# Patient Record
Sex: Male | Born: 1975 | Race: White | Hispanic: No | Marital: Single | Smoking: Current every day smoker
Health system: Southern US, Community
[De-identification: ages and names within clinical notes are randomized; demographics above are authoritative.]

## PROBLEM LIST (undated history)

## (undated) DIAGNOSIS — A4902 Methicillin resistant Staphylococcus aureus infection, unspecified site: Secondary | ICD-10-CM

## (undated) HISTORY — PX: FRACTURE SURGERY: SHX138

---

## 2014-11-01 ENCOUNTER — Ambulatory Visit
Admission: EM | Admit: 2014-11-01 | Discharge: 2014-11-01 | Disposition: A | Discharge status: Home / Self Care | Payer: PRIVATE HEALTH INSURANCE | Attending: Family Medicine | Admitting: Family Medicine

## 2014-11-01 ENCOUNTER — Encounter: Payer: Self-pay | Admitting: Emergency Medicine

## 2014-11-01 DIAGNOSIS — Z021 Encounter for pre-employment examination: Secondary | ICD-10-CM

## 2014-11-01 DIAGNOSIS — Z024 Encounter for examination for driving license: Secondary | ICD-10-CM

## 2014-11-01 DIAGNOSIS — R319 Hematuria, unspecified: Secondary | ICD-10-CM

## 2014-11-01 LAB — DEPT OF TRANSP DIPSTICK, URINE (ARMC ONLY)
Glucose, UA: NEGATIVE mg/dL
Protein, ur: NEGATIVE mg/dL
Specific Gravity, Urine: 1.01 (ref 1.005–1.030)

## 2014-11-01 NOTE — ED Notes (Signed)
DOT Physical

## 2014-11-01 NOTE — ED Notes (Signed)
corrected

## 2014-11-01 NOTE — ED Provider Notes (Signed)
CSN: [id number]     Arrival date & time 11/01/14  1146 History   First MD Initiated Contact with Patient 11/01/14 1244     Chief Complaint  Patient presents with  . DOT Physical    patient is here for DOT (Consider location/radiation/quality/duration/timing/severity/associated sxs/prior Treatment) The history is provided by the patient. No language interpreter was used.    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 History reviewed. No pertinent past medical history. Past Surgical History  Procedure Laterality Date  . Fracture surgery     History reviewed. No pertinent family history. Social History  Substance Use Topics  . Smoking status: Former Games developer  . Smokeless tobacco: None  . Alcohol Use: Yes     Comment: Occasional    Review of Systems  All other systems reviewed and are negative.  Reports that his mother and her family has autoimmune problems and endocrine problems. He is using electronic vapor to stop smoking and he is also set history of left knee surgery right ankle surgery and he denies having regular doctor at this time.  Allergies  Review of patient's allergies indicates no known allergies.  Home Medications   Prior to Admission medications   Not on File   BP 117/80 mmHg  Pulse 100  Temp(Src) 98.2 F (36.8 C) (Oral)  Resp 16  Ht 6' (1.829 m)  Wt 221 lb (100.245 kg)  BMI 29.97 kg/m2  SpO2 96% Physical Exam  Constitutional: He is oriented to person, place, and time. He appears well-developed.  HENT:  Head: Atraumatic.  Eyes: Pupils are equal, round, and reactive to light.  Neck: Normal range of motion. Neck supple. Thyromegaly present.  Cardiovascular: Normal rate, regular rhythm and normal heart sounds.   Pulmonary/Chest: Effort normal.  Abdominal: Soft. Bowel sounds are normal. He exhibits no distension.   Genitourinary: Penis normal.  Musculoskeletal: Normal range of motion.  Neurological: He is alert and oriented to person, place, and time.  Skin: Skin is warm and dry.  Psychiatric: He has a normal mood and affect.  Vitals reviewed.   ED Course  Procedures (including critical care time) Labs Review Labs Reviewed  DEPT OF TRANSP DIPSTICK, URINE([facility] ONLY) - Abnormal; Notable for the following:    Hgb urine dipstick MODERATE (*)    All other components within normal limits    Imaging Review No results found. 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 Hematuria    DOT form filled out urine normal will give him 2 years on form Patient did have blood in his urine and will be informed blood in his urine I do recommend that he have a follow-up with a PCP of his choice to have a repeat urine done   Hassan Rowan, MD 11/02/14 1501

## 2014-11-01 NOTE — Discharge Instructions (Signed)
Preventive Care for Adults A healthy lifestyle and preventive care can promote health and wellness. Preventive health guidelines for men include the following key practices:  A routine yearly physical is a good way to check with your health care provider about your health and preventative screening. It is a chance to share any concerns and updates on your health and to receive a thorough exam.  Visit your dentist for a routine exam and preventative care every 6 months. Brush your teeth twice a day and floss once a day. Good oral hygiene prevents tooth decay and gum disease.  The frequency of eye exams is based on your age, health, family medical history, use of contact lenses, and other factors. Follow your health care provider's recommendations for frequency of eye exams.  Eat a healthy diet. Foods such as vegetables, fruits, whole grains, low-fat dairy products, and lean protein foods contain the nutrients you need without too many calories. Decrease your intake of foods high in solid fats, added sugars, and salt. Eat the right amount of calories for you.Get information about a proper diet from your health care provider, if necessary.  Regular physical exercise is one of the most important things you can do for your health. Most adults should get at least 150 minutes of moderate-intensity exercise (any activity that increases your heart rate and causes you to sweat) each week. In addition, most adults need muscle-strengthening exercises on 2 or more days a week.  Maintain a healthy weight. The body mass index (BMI) is a screening tool to identify possible weight problems. It provides an estimate of body fat based on height and weight. Your health care provider can find your BMI and can help you achieve or maintain a healthy weight.For adults 20 years and older:  A BMI below 18.5 is considered underweight.  A BMI of 18.5 to 24.9 is normal.  A BMI of 25 to 29.9 is considered overweight.  A BMI  of 30 and above is considered obese.  Maintain normal blood lipids and cholesterol levels by exercising and minimizing your intake of saturated fat. Eat a balanced diet with plenty of fruit and vegetables. Blood tests for lipids and cholesterol should begin at age 76 and be repeated every 5 years. If your lipid or cholesterol levels are high, you are over 50, or you are at high risk for heart disease, you may need your cholesterol levels checked more frequently.Ongoing high lipid and cholesterol levels should be treated with medicines if diet and exercise are not working.  If you smoke, find out from your health care provider how to quit. If you do not use tobacco, do not start.  Lung cancer screening is recommended for adults aged 48-80 years who are at high risk for developing lung cancer because of a history of smoking. A yearly low-dose CT scan of the lungs is recommended for people who have at least a 30-pack-year history of smoking and are a current smoker or have quit within the past 15 years. A pack year of smoking is smoking an average of 1 pack of cigarettes a day for 1 year (for example: 1 pack a day for 30 years or 2 packs a day for 15 years). Yearly screening should continue until the smoker has stopped smoking for at least 15 years. Yearly screening should be stopped for people who develop a health problem that would prevent them from having lung cancer treatment.  If you choose to drink alcohol, do not have more than  2 drinks per day. One drink is considered to be 12 ounces (355 mL) of beer, 5 ounces (148 mL) of wine, or 1.5 ounces (44 mL) of liquor.  Avoid use of street drugs. Do not share needles with anyone. Ask for help if you need support or instructions about stopping the use of drugs.  High blood pressure causes heart disease and increases the risk of stroke. Your blood pressure should be checked at least every 1-2 years. Ongoing high blood pressure should be treated with  medicines, if weight loss and exercise are not effective.  If you are 45-79 years old, ask your health care provider if you should take aspirin to prevent heart disease.  Diabetes screening involves taking a blood sample to check your fasting blood sugar level. This should be done once every 3 years, after age 45, if you are within normal weight and without risk factors for diabetes. Testing should be considered at a younger age or be carried out more frequently if you are overweight and have at least 1 risk factor for diabetes.  Colorectal cancer can be detected and often prevented. Most routine colorectal cancer screening begins at the age of 50 and continues through age 75. However, your health care provider may recommend screening at an earlier age if you have risk factors for colon cancer. On a yearly basis, your health care provider may provide home test kits to check for hidden blood in the stool. Use of a small camera at the end of a tube to directly examine the colon (sigmoidoscopy or colonoscopy) can detect the earliest forms of colorectal cancer. Talk to your health care provider about this at age 50, when routine screening begins. Direct exam of the colon should be repeated every 5-10 years through age 75, unless early forms of precancerous polyps or small growths are found.  People who are at an increased risk for hepatitis B should be screened for this virus. You are considered at high risk for hepatitis B if:  You were born in a country where hepatitis B occurs often. Talk with your health care provider about which countries are considered high risk.  Your parents were born in a high-risk country and you have not received a shot to protect against hepatitis B (hepatitis B vaccine).  You have HIV or AIDS.  You use needles to inject street drugs.  You live with, or have sex with, someone who has hepatitis B.  You are a man who has sex with other men (MSM).  You get hemodialysis  treatment.  You take certain medicines for conditions such as cancer, organ transplantation, and autoimmune conditions.  Hepatitis C blood testing is recommended for all people born from 1945 through 1965 and any individual with known risks for hepatitis C.  Practice safe sex. Use condoms and avoid high-risk sexual practices to reduce the spread of sexually transmitted infections (STIs). STIs include gonorrhea, chlamydia, syphilis, trichomonas, herpes, HPV, and human immunodeficiency virus (HIV). Herpes, HIV, and HPV are viral illnesses that have no cure. They can result in disability, cancer, and death.  If you are at risk of being infected with HIV, it is recommended that you take a prescription medicine daily to prevent HIV infection. This is called preexposure prophylaxis (PrEP). You are considered at risk if:  You are a man who has sex with other men (MSM) and have other risk factors.  You are a heterosexual man, are sexually active, and are at increased risk for HIV infection.    infection.  You take drugs by injection.  You are sexually active with a partner who has HIV.  Talk with your health care provider about whether you are at high risk of being infected with HIV. If you choose to begin PrEP, you should first be tested for HIV. You should then be tested every 3 months for as long as you are taking PrEP.  A one-time screening for abdominal aortic aneurysm (AAA) and surgical repair of large AAAs by ultrasound are recommended for men ages 72 to 53 years who are current or former smokers.  Healthy men should no longer receive prostate-specific antigen (PSA) blood tests as part of routine cancer screening. Talk with your health care provider about prostate cancer screening.  Testicular cancer screening is not recommended for adult males who have no symptoms. Screening includes self-exam, a health care provider exam, and other screening tests. Consult with your health care provider about any symptoms  you have or any concerns you have about testicular cancer.  Use sunscreen. Apply sunscreen liberally and repeatedly throughout the day. You should seek shade when your shadow is shorter than you. Protect yourself by wearing long sleeves, pants, a wide-brimmed hat, and sunglasses year round, whenever you are outdoors.  Once a month, do a whole-body skin exam, using a mirror to look at the skin on your back. Tell your health care provider about new moles, moles that have irregular borders, moles that are larger than a pencil eraser, or moles that have changed in shape or color.  Stay current with required vaccines (immunizations).  Influenza vaccine. All adults should be immunized every year.  Tetanus, diphtheria, and acellular pertussis (Td, Tdap) vaccine. An adult who has not previously received Tdap or who does not know his vaccine status should receive 1 dose of Tdap. This initial dose should be followed by tetanus and diphtheria toxoids (Td) booster doses every 10 years. Adults with an unknown or incomplete history of completing a 3-dose immunization series with Td-containing vaccines should begin or complete a primary immunization series including a Tdap dose. Adults should receive a Td booster every 10 years.  Varicella vaccine. An adult without evidence of immunity to varicella should receive 2 doses or a second dose if he has previously received 1 dose.  Human papillomavirus (HPV) vaccine. Males aged 54-21 years who have not received the vaccine previously should receive the 3-dose series. Males aged 22-26 years may be immunized. Immunization is recommended through the age of 50 years for any male who has sex with males and did not get any or all doses earlier. Immunization is recommended for any person with an immunocompromised condition through the age of 59 years if he did not get any or all doses earlier. During the 3-dose series, the second dose should be obtained 4-8 weeks after the first  dose. The third dose should be obtained 24 weeks after the first dose and 16 weeks after the second dose.  Zoster vaccine. One dose is recommended for adults aged 58 years or older unless certain conditions are present.  Measles, mumps, and rubella (MMR) vaccine. Adults born before 75 generally are considered immune to measles and mumps. Adults born in [birth year] or later should have 1 or more doses of MMR vaccine unless there is a contraindication to the vaccine or there is laboratory evidence of immunity to each of the three diseases. A routine second dose of MMR vaccine should be obtained at least 28 days after the first dose for students  attending postsecondary schools, health care workers, or international travelers. People who received inactivated measles vaccine or an unknown type of measles vaccine during 1963-1967 should receive 2 doses of MMR vaccine. People who received inactivated mumps vaccine or an unknown type of mumps vaccine before 1979 and are at high risk for mumps infection should consider immunization with 2 doses of MMR vaccine. Unvaccinated health care workers born before [birth year] who lack laboratory evidence of measles, mumps, or rubella immunity or laboratory confirmation of disease should consider measles and mumps immunization with 2 doses of MMR vaccine or rubella immunization with 1 dose of MMR vaccine.  Pneumococcal 13-valent conjugate (PCV13) vaccine. When indicated, a person who is uncertain of his immunization history and has no record of immunization should receive the PCV13 vaccine. An adult aged 15 years or older who has certain medical conditions and has not been previously immunized should receive 1 dose of PCV13 vaccine. This PCV13 should be followed with a dose of pneumococcal polysaccharide (PPSV23) vaccine. The PPSV23 vaccine dose should be obtained at least 8 weeks after the dose of PCV13 vaccine. An adult aged 56 years or older who has certain medical conditions and  previously received 1 or more doses of PPSV23 vaccine should receive 1 dose of PCV13. The PCV13 vaccine dose should be obtained 1 or more years after the last PPSV23 vaccine dose.  Pneumococcal polysaccharide (PPSV23) vaccine. When PCV13 is also indicated, PCV13 should be obtained first. All adults aged 37 years and older should be immunized. An adult younger than age 32 years who has certain medical conditions should be immunized. Any person who resides in a nursing home or long-term care facility should be immunized. An adult smoker should be immunized. People with an immunocompromised condition and certain other conditions should receive both PCV13 and PPSV23 vaccines. People with human immunodeficiency virus (HIV) infection should be immunized as soon as possible after diagnosis. Immunization during chemotherapy or radiation therapy should be avoided. Routine use of PPSV23 vaccine is not recommended for American Indians, [place] Natives, or people younger than 65 years unless there are medical conditions that require PPSV23 vaccine. When indicated, people who have unknown immunization and have no record of immunization should receive PPSV23 vaccine. One-time revaccination 5 years after the first dose of PPSV23 is recommended for people aged 19-64 years who have chronic kidney failure, nephrotic syndrome, asplenia, or immunocompromised conditions. People who received 1-2 doses of PPSV23 before age 2 years should receive another dose of PPSV23 vaccine at age 18 years or later if at least 5 years have passed since the previous dose. Doses of PPSV23 are not needed for people immunized with PPSV23 at or after age 29 years.  Meningococcal vaccine. Adults with asplenia or persistent complement component deficiencies should receive 2 doses of quadrivalent meningococcal conjugate (MenACWY-D) vaccine. The doses should be obtained at least 2 months apart. Microbiologists working with certain meningococcal bacteria,  [employer] recruits, people at risk during an outbreak, and people who travel to or live in countries with a high rate of meningitis should be immunized. A first-year college student up through age 15 years who is living in a residence hall should receive a dose if he did not receive a dose on or after his 16th birthday. Adults who have certain high-risk conditions should receive one or more doses of vaccine.  Hepatitis A vaccine. Adults who wish to be protected from this disease, have certain high-risk conditions, work with hepatitis A-infected animals, work in hepatitis A research  travel to or work in countries with a high rate of hepatitis A should be immunized. Adults who were previously unvaccinated and who anticipate close contact with an international adoptee during the first 60 days after arrival in the [place] from a country with a high rate of hepatitis A should be immunized.  Hepatitis B vaccine. Adults should be immunized if they wish to be protected from this disease, have certain high-risk conditions, may be exposed to blood or other infectious body fluids, are household contacts or sex partners of hepatitis B positive people, are clients or workers in certain care facilities, or travel to or work in countries with a high rate of hepatitis B.  Haemophilus influenzae type b (Hib) vaccine. A previously unvaccinated person with asplenia or sickle cell disease or having a scheduled splenectomy should receive 1 dose of Hib vaccine. Regardless of previous immunization, a recipient of a hematopoietic stem cell transplant should receive a 3-dose series 6-12 months after his successful transplant. Hib vaccine is not recommended for adults with HIV infection. Preventive Service / Frequency Ages 81 to 7  Blood pressure check.** / Every 1 to 2 years.  Lipid and cholesterol check.** / Every 5 years beginning at age 49.  Hepatitis C blood test.** / For any individual with known risks for  hepatitis C.  Skin self-exam. / Monthly.  Influenza vaccine. / Every year.  Tetanus, diphtheria, and acellular pertussis (Tdap, Td) vaccine.** / Consult your health care provider. 1 dose of Td every 10 years.  Varicella vaccine.** / Consult your health care provider.  HPV vaccine. / 3 doses over 6 months, if 67 or younger.  Measles, mumps, rubella (MMR) vaccine.** / You need at least 1 dose of MMR if you were born in 1957 or later. You may also need a second dose.  Pneumococcal 13-valent conjugate (PCV13) vaccine.** / Consult your health care provider.  Pneumococcal polysaccharide (PPSV23) vaccine.** / 1 to 2 doses if you smoke cigarettes or if you have certain conditions.  Meningococcal vaccine.** / 1 dose if you are age 28 to 74 years and a Market researcher living in a residence hall, or have one of several medical conditions. You may also need additional booster doses.  Hepatitis A vaccine.** / Consult your health care provider.  Hepatitis B vaccine.** / Consult your health care provider.  Haemophilus influenzae type b (Hib) vaccine.** / Consult your health care provider. Ages 35 to 13  Blood pressure check.** / Every 1 to 2 years.  Lipid and cholesterol check.** / Every 5 years beginning at age 12.  Lung cancer screening. / Every year if you are aged 35-80 years and have a 30-pack-year history of smoking and currently smoke or have quit within the past 15 years. Yearly screening is stopped once you have quit smoking for at least 15 years or develop a health problem that would prevent you from having lung cancer treatment.  Fecal occult blood test (FOBT) of stool. / Every year beginning at age 60 and continuing until age 42. You may not have to do this test if you get a colonoscopy every 10 years.  Flexible sigmoidoscopy** or colonoscopy.** / Every 5 years for a flexible sigmoidoscopy or every 10 years for a colonoscopy beginning at age 83 and continuing until age  50.  Hepatitis C blood test.** / For all people born from [birth year] through 1965 and any individual with known risks for hepatitis C.  Skin self-exam. / Monthly.  Influenza vaccine. / Every  year.  Tetanus, diphtheria, and acellular pertussis (Tdap/Td) vaccine.** / Consult your health care provider. 1 dose of Td every 10 years.  Varicella vaccine.** / Consult your health care provider.  Zoster vaccine.** / 1 dose for adults aged 60 years or older.  Measles, mumps, rubella (MMR) vaccine.** / You need at least 1 dose of MMR if you were born in 1957 or later. You may also need a second dose.  Pneumococcal 13-valent conjugate (PCV13) vaccine.** / Consult your health care provider.  Pneumococcal polysaccharide (PPSV23) vaccine.** / 1 to 2 doses if you smoke cigarettes or if you have certain conditions.  Meningococcal vaccine.** / Consult your health care provider.  Hepatitis A vaccine.** / Consult your health care provider.  Hepatitis B vaccine.** / Consult your health care provider.  Haemophilus influenzae type b (Hib) vaccine.** / Consult your health care provider. Ages 65 and over  Blood pressure check.** / Every 1 to 2 years.  Lipid and cholesterol check.**/ Every 5 years beginning at age 20.  Lung cancer screening. / Every year if you are aged 55-80 years and have a 30-pack-year history of smoking and currently smoke or have quit within the past 15 years. Yearly screening is stopped once you have quit smoking for at least 15 years or develop a health problem that would prevent you from having lung cancer treatment.  Fecal occult blood test (FOBT) of stool. / Every year beginning at age 50 and continuing until age 75. You may not have to do this test if you get a colonoscopy every 10 years.  Flexible sigmoidoscopy** or colonoscopy.** / Every 5 years for a flexible sigmoidoscopy or every 10 years for a colonoscopy beginning at age 50 and continuing until age 75.  Hepatitis C blood  test.** / For all people born from 1945 through 1965 and any individual with known risks for hepatitis C.  Abdominal aortic aneurysm (AAA) screening.** / A one-time screening for ages 65 to 75 years who are current or former smokers.  Skin self-exam. / Monthly.  Influenza vaccine. / Every year.  Tetanus, diphtheria, and acellular pertussis (Tdap/Td) vaccine.** / 1 dose of Td every 10 years.  Varicella vaccine.** / Consult your health care provider.  Zoster vaccine.** / 1 dose for adults aged 60 years or older.  Pneumococcal 13-valent conjugate (PCV13) vaccine.** / Consult your health care provider.  Pneumococcal polysaccharide (PPSV23) vaccine.** / 1 dose for all adults aged 65 years and older.  Meningococcal vaccine.** / Consult your health care provider.  Hepatitis A vaccine.** / Consult your health care provider.  Hepatitis B vaccine.** / Consult your health care provider.  Haemophilus influenzae type b (Hib) vaccine.** / Consult your health care provider. **Family history and personal history of risk and conditions may change your health care provider's recommendations. Document Released: 04/28/2001 Document Revised: 03/07/2013 Document Reviewed: 07/28/2010 [employer] Patient Information 2015 [employer]. This information is not intended to replace advice given to you by your health care provider. Make sure you discuss any questions you have with your health care provider.  

## 2015-11-21 ENCOUNTER — Encounter (HOSPITAL_COMMUNITY): Payer: Self-pay | Admitting: Emergency Medicine

## 2015-11-21 ENCOUNTER — Emergency Department (HOSPITAL_COMMUNITY)
Admission: EM | Admit: 2015-11-21 | Discharge: 2015-11-22 | Disposition: A | Discharge status: Home / Self Care | Payer: BLUE CROSS/BLUE SHIELD | Attending: Emergency Medicine | Admitting: Emergency Medicine

## 2015-11-21 DIAGNOSIS — F17200 Nicotine dependence, unspecified, uncomplicated: Secondary | ICD-10-CM | POA: Insufficient documentation

## 2015-11-21 DIAGNOSIS — L02413 Cutaneous abscess of right upper limb: Secondary | ICD-10-CM | POA: Diagnosis not present

## 2015-11-21 DIAGNOSIS — L0291 Cutaneous abscess, unspecified: Secondary | ICD-10-CM

## 2015-11-21 DIAGNOSIS — L539 Erythematous condition, unspecified: Secondary | ICD-10-CM | POA: Diagnosis present

## 2015-11-21 DIAGNOSIS — L03113 Cellulitis of right upper limb: Secondary | ICD-10-CM

## 2015-11-21 NOTE — ED Triage Notes (Signed)
Pt here with insect bite to right forearm. Pt has swelling to arm with some redness. Pt reports fevers at home, afebrile at this time. Pt sts he started taking clindamycin yesterday.

## 2015-11-21 NOTE — ED Notes (Signed)
updated

## 2015-11-22 MED ORDER — HYDROCODONE-ACETAMINOPHEN 5-325 MG PO TABS: 2.0000 | ORAL_TABLET | ORAL | 0 refills | Status: DC | PRN

## 2015-11-22 MED ORDER — CEFTRIAXONE SODIUM 1 G IJ SOLR
1.0000 g | Freq: Once | INTRAMUSCULAR | Status: AC
  Administered 2015-11-22: 1 g via INTRAMUSCULAR
  Filled 2015-11-22: qty 10

## 2015-11-22 MED ORDER — HYDROCODONE-ACETAMINOPHEN 5-325 MG PO TABS
2.0000 | ORAL_TABLET | Freq: Once | ORAL | Status: AC
Start: 2015-11-22 — End: 2015-11-22
  Administered 2015-11-22: 2 via ORAL
  Filled 2015-11-22: qty 2

## 2015-11-22 MED ORDER — STERILE WATER FOR INJECTION IJ SOLN
INTRAMUSCULAR | Status: AC
  Administered 2015-11-22: 10 mL
  Filled 2015-11-22: qty 10

## 2015-11-22 NOTE — Discharge Instructions (Signed)
Soak, and remove gauze from the wound in 3 days.  Return to ER if not completely resolved by the time you complete your antibiotics.

## 2015-11-22 NOTE — ED Provider Notes (Signed)
[facility]EM[facility]ENCY DEPT Provider Note   CSN: [id number] Arrival date & time: 11/21/15  2125     History   Chief Complaint Chief Complaint  Patient presents with  . Insect Bite    HPI Ronald Jefferson is a 40 y.o. male. He presents with a chief complaint of an insect bite or sting. He states for 5 days ago he felt something sting him on his right arm. He states he swatted at it but did not feel anything. He knows the redness in the next day. It progressed until yesterday. He was seen in urgent care and placed on clindamycin. It is spread beyond the borders were was demarcated by himself at the urgent care yesterday and presents here. He states he had fevers and chills today at home.  Patient reports allergy to amoxicillin. He states he's had clinical myosin since yesterday. He has had Keflex in the past without reaction when asked about certain he has this he states "100% certain".  HPI  History reviewed. No pertinent past medical history.  There are no active problems to display for this patient.   Past Surgical History:  Procedure Laterality Date  . FRACTURE SURGERY         Home Medications    Prior to Admission medications   Not on File    Family History History reviewed. No pertinent family history.  Social History Social History  Substance Use Topics  . Smoking status: Current Every Day Smoker    Packs/day: 0.50  . Smokeless tobacco: Never Used  . Alcohol use Yes     Comment: Occasional     Allergies   Amoxicillin and Toradol [ketorolac tromethamine]   Review of Systems Review of Systems  Constitutional: Negative for appetite change, chills, diaphoresis, fatigue and fever.  HENT: Negative for mouth sores, sore throat and trouble swallowing.   Eyes: Negative for visual disturbance.  Respiratory: Negative for cough, chest tightness, shortness of breath and wheezing.   Cardiovascular: Negative for chest pain.  Gastrointestinal: Negative for abdominal  distention, abdominal pain, diarrhea, nausea and vomiting.  Endocrine: Negative for polydipsia, polyphagia and polyuria.  Genitourinary: Negative for dysuria, frequency and hematuria.  Musculoskeletal: Negative for gait problem.  Skin: Positive for color change. Negative for pallor and rash.  Neurological: Negative for dizziness, syncope, light-headedness and headaches.  Hematological: Does not bruise/bleed easily.  Psychiatric/Behavioral: Negative for behavioral problems and confusion.     Physical Exam Updated Vital Signs BP 113/76 (BP Location: Left Arm)   Pulse 83   Temp 97.9 F (36.6 C) (Oral)   Resp 16   Ht 6' (1.829 m)   Wt 218 lb (98.9 kg)   SpO2 97%   BMI 29.57 kg/m   Physical Exam  Constitutional: He is oriented to person, place, and time. He appears well-developed and well-nourished. No distress.  HENT:  Head: Normocephalic.  Eyes: Conjunctivae are normal. Pupils are equal, round, and reactive to light. No scleral icterus.  Neck: Normal range of motion. Neck supple. No thyromegaly present.  Cardiovascular: Normal rate and regular rhythm.  Exam reveals no gallop and no friction rub.   No murmur heard. Pulmonary/Chest: Effort normal and breath sounds normal. No respiratory distress. He has no wheezes. He has no rales.  Abdominal: Soft. Bowel sounds are normal. He exhibits no distension. There is no tenderness. There is no rebound.  Musculoskeletal: Normal range of motion.  Neurological: He is alert and oriented to person, place, and time.  Skin: Skin is warm and dry.  No rash noted.  Area of induration and fluctuance proximal to the size of a quarter on the patient's medial volar right forearm. Some lytic spread to just short of the elbow.  Psychiatric: He has a normal mood and affect. His behavior is normal.   Limited bedside ultrasound confirms retained fluid collection.   ED Treatments / Results  Labs (all labs ordered are listed, but only abnormal results are  displayed) Labs Reviewed - No data to display  EKG  EKG Interpretation None       Radiology No results found.  Procedures Procedures (including critical care time)  INCISION AND DRAINAGE Performed by: Claudean Kinds Consent: Verbal consent obtained. Risks and benefits: risks, benefits and alternatives were discussed Type: abscess  Body area: Rt FA  Anesthesia: local infiltration  Incision was made with a scalpel.  Local anesthetic: lidocaine 1% c  epinephrine  Anesthetic total:  ml  Complexity: complex Blunt dissection to break up loculations  Drainage: purulent  Drainage amount: scant  Packing material: 1/4 in iodoform gauze  Patient tolerance: Patient tolerated the procedure well with no immediate complications.     Medications Ordered in ED Medications  HYDROcodone-acetaminophen (NORCO/VICODIN) 5-325 MG per tablet 2 tablet (not administered)  cefTRIAXone (ROCEPHIN) injection 1 g (not administered)     Initial Impression / Assessment and Plan / ED Course  I have reviewed the triage vital signs and the nursing notes.  Pertinent labs & imaging results that were available during my care of the patient were reviewed by me and considered in my medical decision making (see chart for details).  Clinical Course    Given IM Rocephin. After incision and drainage ace and dressing was placed around the arm. Discharge home. Continue clindamycin. Vicodin for pain. So can remove the gauze in 48 hours. Return to ER or primary care physician if not improving  Final Clinical Impressions(s) / ED Diagnoses   Final diagnoses:  None    New Prescriptions New Prescriptions   No medications on file     [name], MD 11/22/15 0015

## 2015-11-22 NOTE — Care Management (Signed)
Call received and pt reports he did not receive Rxs. Contacted ED NCM, Burna MortimerWanda and will follow up with pt. Isidoro DonningAlesia Shalev Helminiak RN CCM Case Mgmt phone [phone]

## 2015-11-23 ENCOUNTER — Encounter (HOSPITAL_COMMUNITY): Payer: Self-pay

## 2015-11-23 ENCOUNTER — Observation Stay (HOSPITAL_COMMUNITY)
Admission: EM | Admit: 2015-11-23 | Discharge: 2015-11-24 | Disposition: A | Discharge status: Home / Self Care | Payer: BLUE CROSS/BLUE SHIELD | Attending: Family Medicine | Admitting: Family Medicine

## 2015-11-23 DIAGNOSIS — Z7982 Long term (current) use of aspirin: Secondary | ICD-10-CM | POA: Insufficient documentation

## 2015-11-23 DIAGNOSIS — L03113 Cellulitis of right upper limb: Secondary | ICD-10-CM | POA: Diagnosis not present

## 2015-11-23 DIAGNOSIS — K122 Cellulitis and abscess of mouth: Secondary | ICD-10-CM | POA: Diagnosis present

## 2015-11-23 DIAGNOSIS — L0291 Cutaneous abscess, unspecified: Secondary | ICD-10-CM

## 2015-11-23 DIAGNOSIS — A419 Sepsis, unspecified organism: Secondary | ICD-10-CM

## 2015-11-23 DIAGNOSIS — F17210 Nicotine dependence, cigarettes, uncomplicated: Secondary | ICD-10-CM | POA: Insufficient documentation

## 2015-11-23 DIAGNOSIS — L02413 Cutaneous abscess of right upper limb: Principal | ICD-10-CM | POA: Insufficient documentation

## 2015-11-23 DIAGNOSIS — L0390 Cellulitis, unspecified: Secondary | ICD-10-CM | POA: Diagnosis present

## 2015-11-23 LAB — BASIC METABOLIC PANEL
Anion gap: 9 (ref 5–15)
BUN: 8 mg/dL (ref 6–20)
CHLORIDE: 103 mmol/L (ref 101–111)
CO2: 24 mmol/L (ref 22–32)
CREATININE: 1.04 mg/dL (ref 0.61–1.24)
Calcium: 9.3 mg/dL (ref 8.9–10.3)
GFR calc Af Amer: >60 mL/min (ref >60)
GLUCOSE: 126 mg/dL — AB (ref 65–99)
Potassium: 3.6 mmol/L (ref 3.5–5.1)
SODIUM: 136 mmol/L (ref 135–145)

## 2015-11-23 LAB — CBC WITH DIFFERENTIAL/PLATELET
Basophils Absolute: 0 10*3/uL (ref 0.0–0.1)
Basophils Relative: 0 %
EOS ABS: 0.1 10*3/uL (ref 0.0–0.7)
EOS PCT: 1 %
HCT: 45.1 % (ref 39.0–52.0)
Hemoglobin: 15.7 g/dL (ref 13.0–17.0)
LYMPHS ABS: 1.6 10*3/uL (ref 0.7–4.0)
Lymphocytes Relative: 11 %
MCH: 32.5 pg (ref 26.0–34.0)
MCHC: 34.8 g/dL (ref 30.0–36.0)
MCV: 93.4 fL (ref 78.0–100.0)
MONOS PCT: 5 %
Monocytes Absolute: 0.7 10*3/uL (ref 0.1–1.0)
Neutro Abs: 12.2 10*3/uL — ABNORMAL HIGH (ref 1.7–7.7)
Neutrophils Relative %: 83 %
PLATELETS: 301 10*3/uL (ref 150–400)
RBC: 4.83 MIL/uL (ref 4.22–5.81)
RDW: 11.8 % (ref 11.5–15.5)
WBC: 14.6 10*3/uL — ABNORMAL HIGH (ref 4.0–10.5)

## 2015-11-23 LAB — I-STAT CG4 LACTIC ACID, ED: LACTIC ACID, VENOUS: 2.09 mmol/L — AB (ref 0.5–1.9)

## 2015-11-23 MED ORDER — ONDANSETRON HCL 4 MG/2ML IJ SOLN: 4.0000 mg | Freq: Four times a day (QID) | INTRAMUSCULAR | Status: DC | PRN

## 2015-11-23 MED ORDER — MORPHINE SULFATE (PF) 2 MG/ML IV SOLN
2.0000 mg | INTRAVENOUS | Status: DC | PRN
  Administered 2015-11-23: 2 mg via INTRAVENOUS
  Filled 2015-11-23: qty 1

## 2015-11-23 MED ORDER — SODIUM CHLORIDE 0.9 % IV BOLUS (SEPSIS)
1000.0000 mL | Freq: Once | INTRAVENOUS | Status: AC
  Administered 2015-11-23: 1000 mL via INTRAVENOUS

## 2015-11-23 MED ORDER — MORPHINE SULFATE (PF) 4 MG/ML IV SOLN
4.0000 mg | Freq: Once | INTRAVENOUS | Status: AC
  Administered 2015-11-23: 4 mg via INTRAVENOUS
  Filled 2015-11-23: qty 1

## 2015-11-23 MED ORDER — PNEUMOCOCCAL VAC POLYVALENT 25 MCG/0.5ML IJ INJ
0.5000 mL | INJECTION | INTRAMUSCULAR | Status: DC
  Filled 2015-11-23: qty 0.5

## 2015-11-23 MED ORDER — DEXTROSE 5 % IV SOLN
1.0000 g | Freq: Once | INTRAVENOUS | Status: AC
  Administered 2015-11-23: 1 g via INTRAVENOUS
  Filled 2015-11-23: qty 10

## 2015-11-23 MED ORDER — VANCOMYCIN HCL 10 G IV SOLR
2000.0000 mg | Freq: Once | INTRAVENOUS | Status: AC
  Administered 2015-11-23: 2000 mg via INTRAVENOUS
  Filled 2015-11-23: qty 2000

## 2015-11-23 MED ORDER — ENOXAPARIN SODIUM 40 MG/0.4ML ~~LOC~~ SOLN
40.0000 mg | SUBCUTANEOUS | Status: DC
  Administered 2015-11-23: 40 mg via SUBCUTANEOUS
  Filled 2015-11-23: qty 0.4

## 2015-11-23 MED ORDER — LIDOCAINE-EPINEPHRINE (PF) 2 %-1:200000 IJ SOLN
10.0000 mL | Freq: Once | INTRAMUSCULAR | Status: AC
  Administered 2015-11-23: 10 mL
  Filled 2015-11-23: qty 20

## 2015-11-23 MED ORDER — SODIUM CHLORIDE 0.9 % IV SOLN
1500.0000 mg | Freq: Two times a day (BID) | INTRAVENOUS | Status: DC
  Administered 2015-11-24 (×2): 1500 mg via INTRAVENOUS
  Filled 2015-11-23 (×3): qty 1500

## 2015-11-23 MED ORDER — ONDANSETRON HCL 4 MG/2ML IJ SOLN
4.0000 mg | Freq: Once | INTRAMUSCULAR | Status: AC
  Administered 2015-11-23: 4 mg via INTRAVENOUS
  Filled 2015-11-23: qty 2

## 2015-11-23 MED ORDER — SODIUM CHLORIDE 0.9 % IV SOLN
INTRAVENOUS | Status: DC
  Administered 2015-11-23 – 2015-11-24 (×2): via INTRAVENOUS

## 2015-11-23 MED ORDER — ONDANSETRON HCL 4 MG PO TABS: 4.0000 mg | ORAL_TABLET | Freq: Four times a day (QID) | ORAL | Status: DC | PRN

## 2015-11-23 NOTE — Progress Notes (Signed)
Pharmacy Antibiotic Note Ronald Jefferson is a 40 y.o. male admitted on 11/23/2015 with cellulitis and abscess that is s/p bedside I&D in ED.  Pharmacy has been consulted for vancomycin dosing. Pt received 2 gram IV vancomycin x 1 in the ED  Plan: 1. Begin vancomycin 1500 mg every 12 hours on 9/10 am 2. Follow up starting Cefazolin or ceftriaxone for strep and gram negative coverage 3. Follow up culture data  Height: 6' (182.9 cm) Weight: 218 lb (98.9 kg) IBW/kg (Calculated) : 77.6  Temp (24hrs), Avg:97.9 F (36.6 C), Min:97.9 F (36.6 C), Max:97.9 F (36.6 C)   Recent Labs Lab 11/23/15 1542 11/23/15 1548  WBC 14.6*  --   CREATININE 1.04  --   LATICACIDVEN  --  2.09*    Estimated Creatinine Clearance: 113.8 mL/min (by C-G formula based on SCr of 1.04 mg/dL).    Allergies  Allergen Reactions  . Amoxicillin Anaphylaxis  . Toradol [Ketorolac Tromethamine] Anaphylaxis    Antimicrobials this admission: 9/9 Ceftriaxone x 1 dose  9/9 vancomycin  >>   Dose adjustments this admission: n/a  Microbiology results: 9/9 BCx: px   Thank you for allowing pharmacy to be a part of this patient's care.  Pollyann SamplesAndy Madilynne Mullan, PharmD, BCPS 11/23/2015, 5:06 PM Pager: [phone]

## 2015-11-23 NOTE — ED Provider Notes (Signed)
[facility] Provider Note   CSN: [id number] Arrival date & time: 11/23/15  1333   History   Chief Complaint Chief Complaint  Patient presents with  . Abscess    HPI Ronald Jefferson is a 40 y.o. male  The history is provided by the patient, medical records and a relative.   40 year old male with no reported significant past history presenting with complaint of arm pain and skin infection. Onset was Wednesday. Context-patient states he had probably an insect bite or sting, and has had worsening skin infection since then. Previous treatments include being started on clindamycin on Wednesday urgent care, followed by presentation here with IM Rocephin and I&D performed at bedside late Thursday night. He was continued on clindamycin since then. However, patient reports ongoing severe pain, located in right forearm wound area, described as throbbing, now spreading up to his forearm. Associated redness and increased warmth, swelling of forearm and hand, and some chills and nausea. He has not taken his temperature to know if he has a fever. He did miss one dose of clindamycin only but otherwise has been compliant with meds at home. Denies IVDU, possible foreign body that could be retained in the wound and states his ultrasound at bedside prior to I&D did not show foreign body previously.    PMH: denies   There are no active problems to display for this patient.   Past Surgical History:  Procedure Laterality Date  . FRACTURE SURGERY       Home Medications    Prior to Admission medications   Medication Sig Start Date End Date Taking? Authorizing Provider  clindamycin (CLEOCIN) 300 MG capsule Take 300 mg by mouth 3 (three) times daily. 11/20/15  Yes Historical Provider, MD  HYDROcodone-acetaminophen (NORCO/VICODIN) 5-325 MG tablet Take 2 tablets by mouth every 4 (four) hours as needed. Patient not taking: Reported on 11/23/2015 11/22/15   [name], MD    Family History History reviewed.  No pertinent family history.  Social History Social History  Substance Use Topics  . Smoking status: Current Every Day Smoker    Packs/day: 0.50  . Smokeless tobacco: Never Used  . Alcohol use Yes     Comment: Occasional     Allergies   Amoxicillin and Toradol [ketorolac tromethamine]   Review of Systems Review of Systems  Constitutional: Positive for chills.  HENT: Negative for rhinorrhea.   Respiratory: Negative for cough and shortness of breath.   Cardiovascular: Negative for chest pain.  Gastrointestinal: Positive for nausea. Negative for abdominal pain and vomiting.  Genitourinary: Negative for dysuria and hematuria.  Musculoskeletal: Positive for myalgias.  Skin: Positive for color change and wound.  Allergic/Immunologic: Negative for immunocompromised state.  Neurological: Negative for weakness and numbness.  Hematological: Does not bruise/bleed easily.  All other systems reviewed and are negative.   Physical Exam Updated Vital Signs BP 125/93   Pulse 88   Temp 97.9 F (36.6 C) (Oral)   Resp 20   Ht 6' (1.829 m)   Wt 98.9 kg   SpO2 98%   BMI 29.57 kg/m   Physical Exam  Constitutional: He appears well-developed and well-nourished.  HENT:  Head: Normocephalic and atraumatic.  Eyes: Conjunctivae are normal.  Neck: Neck supple.  Cardiovascular: Regular rhythm and intact distal pulses.  Tachycardia present.   No murmur heard. Pulmonary/Chest: Effort normal and breath sounds normal. No respiratory distress.  Abdominal: Soft. There is no tenderness.  Musculoskeletal: He exhibits tenderness.  Right forearm abscess with 0.5cm open  tract with purulent drainage, surrounding erythema and induration, with increased warmth and erythema throughout the forearm and proximal hand. NVI in hand / distal extremity.   Neurological: He is alert.  Skin: Skin is warm and dry.  Psychiatric: He has a normal mood and affect.  Nursing note and vitals reviewed.    ED  Treatments / Results  Labs (all labs ordered are listed, but only abnormal results are displayed) Labs Reviewed  CBC WITH DIFFERENTIAL/PLATELET - Abnormal; Notable for the following:       Result Value   WBC 14.6 (*)    Neutro Abs 12.2 (*)    All other components within normal limits  BASIC METABOLIC PANEL - Abnormal; Notable for the following:    Glucose, Bld 126 (*)    All other components within normal limits  I-STAT CG4 LACTIC ACID, ED - Abnormal; Notable for the following:    Lactic Acid, Venous 2.09 (*)    All other components within normal limits  CULTURE, BLOOD (ROUTINE X 2)  CULTURE, BLOOD (ROUTINE X 2)    EKG  EKG Interpretation None       Radiology No results found.  Procedures .Marland Kitchen.Incision and Drainage Date/Time: 11/23/2015 4:15 PM Performed by: Urban GibsonBEATTY, [place] Sigmon Authorized by: Cathren LaineSTEINL, KEVIN   Consent:    Consent obtained:  Verbal   Consent given by:  Patient Location:    Type:  Abscess   Location:  Upper extremity   Upper extremity location:  Arm   Arm location:  R lower arm Pre-procedure details:    Skin preparation:  Chloraprep Anesthesia (see MAR for exact dosages):    Anesthesia method:  Local infiltration   Local anesthetic:  Lidocaine 2% WITH epi Procedure type:    Complexity:  Complex Procedure details:    Incision types:  Single straight   Scalpel blade:  11   Wound management:  Probed and deloculated and extensive cleaning   Drainage:  Purulent   Drainage amount:  Moderate   Wound treatment:  Drain placed   Packing materials:  1/4 in iodoform gauze Post-procedure details:    Patient tolerance of procedure:  Tolerated well, no immediate complications   (including critical care time)  Medications Ordered in ED Medications  vancomycin (VANCOCIN) 2,000 mg in sodium chloride 0.9 % 500 mL IVPB (2,000 mg Intravenous New Bag/Given 11/23/15 1633)  sodium chloride 0.9 % bolus 1,000 mL (not administered)  vancomycin (VANCOCIN) 1,500 mg in sodium  chloride 0.9 % 500 mL IVPB (not administered)  sodium chloride 0.9 % bolus 1,000 mL (1,000 mLs Intravenous New Bag/Given 11/23/15 1552)  ondansetron (ZOFRAN) injection 4 mg (4 mg Intravenous Given 11/23/15 1551)  morphine 4 MG/ML injection 4 mg (4 mg Intravenous Given 11/23/15 1551)  cefTRIAXone (ROCEPHIN) 1 g in dextrose 5 % 50 mL IVPB (0 g Intravenous Stopped 11/23/15 1633)  lidocaine-EPINEPHrine (XYLOCAINE W/EPI) 2 %-1:200000 (PF) injection 10 mL (10 mLs Infiltration Given by Other 11/23/15 1618)     Initial Impression / Assessment and Plan / ED Course  I have reviewed the triage vital signs and the nursing notes.  Pertinent labs & imaging results that were available during my care of the patient were reviewed by me and considered in my medical decision making (see chart for details).  Clinical Course    40 yo M with hx recent I&D of abscess presenting with ongoing and worsening redness, pain, and drainage of abscess site and surrounding forearm despite PO antibiotics and prior I&D. Afebrile, tachycardic but otherwise  HDS. Labs notable for leukocytosis, mild lactic acidosis concerning for sepsis 2/2 skin infection. Cx obtained, I&D done to reopen area and allow further drainage with larger incision. Admitted for IV abx and further tx.   Case discussed with Dr. Denton Lank who oversaw management of this patient.    Final Clinical Impressions(s) / ED Diagnoses   Final diagnoses:  Abscess  Cellulitis of right upper extremity  Sepsis, due to unspecified organism [facility])    New Prescriptions New Prescriptions   No medications on file     Urban Gibson, MD 11/23/15 1718    Cathren Laine, MD 11/23/15 2014

## 2015-11-23 NOTE — ED Notes (Signed)
MD at bedside. 

## 2015-11-23 NOTE — ED Notes (Signed)
Attempted report 

## 2015-11-23 NOTE — H&P (Signed)
[facility] H&P   Patient Demographics:    Ronald Jefferson, is a 40 y.o. male  MRN: [id number]  DOB - Sep 07, 1975  Admit Date - 11/23/2015  Outpatient Primary MD for the patient is Berneice GandyKahn, Kevin, MD  Referring MD/NP/PA: Urban GibsonBeatty Jenny  Patient coming from: Home   Chief Complaint  Patient presents with  . Abscess      HPI:    Ronald Jefferson  is a 40 y.o. male,with no significant medical problems, came to the hospital with worsening pain and swelling of right forearm. As the patient he noticed an insect bite last Saturday while he was on vacation. The redness and swelling became worse next day and patient experienced sweating along with fever and chills. He was seen at urgent care on Wednesday and will start her on clindamycin. As patient did not improve he was seen at [facility]: Unpleasant night and was given IM Rocephin and underwent I&D performed at bedside. Patient was discharged on clindamycin. [name] came back as patient noticed swelling extending up the right hand, and redness extending up the forearm.  In the ED patient again underwent I&D, started on vancomycin. Lactic acid 2.09, WBC 14.6. Patient will be placed in observation, for IV antibiotics.    Review of systems:    In addition to the HPI above, * +Fever-chills, No Headache, No changes with Vision or hearing, No problems swallowing food or Liquids, No Chest pain, Cough or Shortness of Breath, No Abdominal pain, + Nausea or Vomiting, bowel movements are regular, No Blood in stool or Urine, No dysuria, No new skin rashes or bruises, No new joints pains-aches,  No new weakness, tingling, numbness in any extremity, No recent weight gain or loss, No polyuria, polydypsia or polyphagia, No significant Mental Stressors.  A full 10 point Review of Systems was done, except as stated above, all other Review of Systems were negative.   With Past History of the  following :    No past medical history on file.    Past Surgical History:  Procedure Laterality Date  . FRACTURE SURGERY        Social History:     Social History  Substance Use Topics  . Smoking status: Current Every Day Smoker    Packs/day: 0.50  . Smokeless tobacco: Never Used  . Alcohol use Yes     Comment: Occasional        Family History :   No family history of cancer or CAD   Home Medications:   Prior to Admission medications   Medication Sig Start Date End Date Taking? Authorizing Provider  Aspirin-Acetaminophen-Caffeine (GOODY HEADACHE PO) Take 1 packet by mouth 2 (two) times daily as needed (for headaches).   Yes Historical Provider, MD  clindamycin (CLEOCIN) 300 MG capsule Take 300 mg by mouth 3 (three) times daily. 11/20/15  Yes Historical Provider, MD  neomycin-bacitracin-polymyxin (NEOSPORIN) ointment Apply 1 application topically 2 (two) times daily as needed for wound care. apply to eye   Yes Historical Provider, MD  HYDROcodone-acetaminophen (NORCO/VICODIN) 5-325 MG tablet Take 2 tablets by mouth every 4 (four) hours as needed. Patient not taking: Reported on 11/23/2015 11/22/15   Ronald Porter, MD     Allergies:     Allergies  Allergen Reactions  . Amoxicillin Anaphylaxis  . Toradol [Ketorolac Tromethamine] Anaphylaxis     Physical Exam:   Vitals  Blood pressure 125/93, pulse 88, temperature 97.9 F (36.6 C), temperature source Oral, resp. rate 20, height 6' (1.829 m), weight 98.9 kg (218 lb), SpO2 98 %.   1. General caucasian male lying in bed in NAD, cooperative* with exam  2. Normal affect and insight, Awake Alert, Oriented X 3.  3. No F.N deficits, ALL C.Nerves Intact, Strength 5/5 all 4 extremities, Sensation intact all 4 extremities, Plantars down going.  4. Ears and Eyes appear Normal, Conjunctivae clear, PERRLA. Moist Oral Mucosa.  5. Supple Neck, No JVD, No cervical lymphadenopathy appriciated, No Carotid Bruits.  6. Symmetrical  Chest wall movement, Good air movement bilaterally, CTAB.  7. RRR, No Gallops, Rubs or Murmurs, No Parasternal Heave.No Leg edema  8. Positive Bowel Sounds, Abdomen Soft, No tenderness, No organomegaly appriciated,No rebound -guarding or rigidity.  9.  No Cyanosis, dressing noted in right forearm, skin warm to touch, mild swelling in the right hand with minimal erythema  10. Good muscle tone,  joints appear normal , no effusions, Normal ROM.      Data Review:    CBC  Recent Labs Lab 11/23/15 1542  WBC 14.6*  HGB 15.7  HCT 45.1  PLT 301  MCV 93.4  MCH 32.5  MCHC 34.8  RDW 11.8  LYMPHSABS 1.6  MONOABS 0.7  EOSABS 0.1  BASOSABS 0.0   ------------------------------------------------------------------------------------------------------------------  Chemistries   Recent Labs Lab 11/23/15 1542  NA 136  K 3.6  CL 103  CO2 24  GLUCOSE 126*  BUN 8  CREATININE 1.04  CALCIUM 9.3   ------------------------------------------------------------------------------------------------------------------  ------------------------------------------------------------------------------------------------------------------ GFR:  --------------------------------------------------------------------------------------------------------------- Urine analysis:    Component Value Date/Time   LABSPEC 1.010 11/01/2014 1310   GLUCOSEU NEGATIVE 11/01/2014 1310   HGBUR MODERATE (A) 11/01/2014 1310   PROTEINUR NEGATIVE 11/01/2014 1310      ----------------------------------------------------------------------------------------------------------------   Imaging Results:    No results found.     Assessment & Plan:    Active Problems:   Cellulitis and abscess of oral soft tissues   Cellulitis   1. Cellulitis and abscess of right forearm- status post I&D at bedside, started on vancomycin. Place patient in observation, continue vancomycin per pharmacy consolidation. Start morphine  2 mg IV every 4 hours when necessary for pain. Blood cultures 2 have been obtained. Follow lactic acid.    DVT Prophylaxis-   Lovenox  AM Labs Ordered, also please review Full Orders  Family Communication: No family present at bedside  Code Status:  Full code  Admission status: Observation  Time spent in minutes : 55 min   Legend Pecore S M.D on 11/23/2015 at 5:38 PM  Between 7am to 7pm - Pager - [phone]. After 7pm go to [email or web address] - password [email or web address]  [facility] - Office  [phone]

## 2015-11-23 NOTE — ED Notes (Signed)
Admitting MD at bedside.

## 2015-11-24 DIAGNOSIS — K122 Cellulitis and abscess of mouth: Secondary | ICD-10-CM

## 2015-11-24 LAB — BASIC METABOLIC PANEL
Anion gap: 7 (ref 5–15)
CALCIUM: 8.9 mg/dL (ref 8.9–10.3)
CO2: 25 mmol/L (ref 22–32)
CREATININE: 0.88 mg/dL (ref 0.61–1.24)
Chloride: 107 mmol/L (ref 101–111)
GFR calc non Af Amer: >60 mL/min (ref >60)
GLUCOSE: 101 mg/dL — AB (ref 65–99)
Potassium: 3.8 mmol/L (ref 3.5–5.1)
Sodium: 139 mmol/L (ref 135–145)

## 2015-11-24 LAB — CBC
HCT: 41.5 % (ref 39.0–52.0)
Hemoglobin: 14.1 g/dL (ref 13.0–17.0)
MCH: 31.6 pg (ref 26.0–34.0)
MCHC: 34 g/dL (ref 30.0–36.0)
MCV: 93 fL (ref 78.0–100.0)
PLATELETS: 293 10*3/uL (ref 150–400)
RBC: 4.46 MIL/uL (ref 4.22–5.81)
RDW: 11.8 % (ref 11.5–15.5)
WBC: 8.3 10*3/uL (ref 4.0–10.5)

## 2015-11-24 MED ORDER — SULFAMETHOXAZOLE-TRIMETHOPRIM 800-160 MG PO TABS: 1.0000 | ORAL_TABLET | Freq: Two times a day (BID) | ORAL | 0 refills | Status: DC

## 2015-11-24 MED ORDER — SULFAMETHOXAZOLE-TRIMETHOPRIM 800-160 MG PO TABS: 1.0000 | ORAL_TABLET | Freq: Two times a day (BID) | ORAL | 0 refills | Status: AC

## 2015-11-24 NOTE — Discharge Summary (Addendum)
Physician Discharge Summary  Ronald MaladySimon Jefferson EAV:[id number] DOB: 03/16/1976 DOA: 11/23/2015  PCP: Berneice GandyKahn, Kevin, MD  Admit date: 11/23/2015 Discharge date: 11/24/2015  Admitted From: Home Disposition:  Home  Recommendations for Outpatient Follow-up:  1. Follow up with PCP this week to reassess wound  Home Health: No Equipment/Devices: None  Discharge Condition:Stable CODE STATUS:Full code Diet recommendation: Heart Healthy  Brief/Interim Summary: Ronald MaladySimon Jefferson is a 40 y.o. male with no significant medical history who presents to emergency department with a right forearm abscess and cellulitis. He was previously treated with clindamycin from an urgent care visit little over 1 week ago. He was initially seen in the emergency department on September 7, where he received an IND and ceftriaxone IM. Symptoms progressed and he returned to the emergency department on September 9 where he received another I&D, and was started on vancomycin IV for associated cellulitis. His vitals were stable and he has no associated nausea or vomiting or fever. Overnight he was afebrile and asymptomatic. In the morning, cellulitis had resolved and his currently only located over I&D wound. Wound is open without drainage. There is no fluctuance on exam and minimal tenderness on palpation. Patient was transitioned to oral Bactrim for discharge home with strict precautions for follow-up and a few days with his PCP.  Discharge Diagnoses:  Active Problems:   Cellulitis and abscess of right forearm    Discharge Instructions     Medication List    STOP taking these medications   clindamycin 300 MG capsule Commonly known as:  CLEOCIN   GOODY HEADACHE PO   HYDROcodone-acetaminophen 5-325 MG tablet Commonly known as:  NORCO/VICODIN     TAKE these medications   neomycin-bacitracin-polymyxin ointment Commonly known as:  NEOSPORIN Apply 1 application topically 2 (two) times daily as needed for wound care. apply to eye    sulfamethoxazole-trimethoprim 800-160 MG tablet Commonly known as:  BACTRIM DS,SEPTRA DS Take 1 tablet by mouth 2 (two) times daily.      Follow-up Information    Berneice GandyKahn, Kevin, MD. Schedule an appointment as soon as possible for a visit in 3 day(s).   Specialty:  Neurology Contact information: [address] [place] [address] [phone]          Allergies  Allergen Reactions  . Amoxicillin Anaphylaxis  . Toradol [Ketorolac Tromethamine] Anaphylaxis    Consultations:  None   Procedures/Studies: No results found.  Subjective:  Patient reports significantly improve symptoms.  Discharge Exam: Vitals:   11/23/15 2104 11/24/15 0522  BP: 116/73 139/88  Pulse: 99 76  Resp: 18 18  Temp: 97.7 F (36.5 C) 97.9 F (36.6 C)   Vitals:   11/23/15 1800 11/23/15 1828 11/23/15 2104 11/24/15 0522  BP: 137/93 126/78 116/73 139/88  Pulse: 78 82 99 76  Resp:  20 18 18   Temp:  98 F (36.7 C) 97.7 F (36.5 C) 97.9 F (36.6 C)  TempSrc:  Oral Oral Oral  SpO2: 100% 100% 100% 100%  Weight:  97.9 kg (215 lb 13.3 oz)    Height:  6' (1.829 m)      General: Pt is alert, awake, not in acute distress Cardiovascular: RRR, S1/S2 +, no rubs, no gallops Respiratory: CTA bilaterally, no wheezing, no rhonchi Abdominal: Soft, NT, ND, bowel sounds + Extremities: no edema, no cyanosis Skin: On his right forearm, there is some mild erythema surrounding an open wound. No fluctuance palpated. Minimal tenderness. No drainage.     The results of significant diagnostics from this  hospitalization (including imaging, microbiology, ancillary and laboratory) are listed below for reference.     Microbiology: Recent Results (from the past 240 hour(s))  Blood culture (routine x 2)     Status: None (Preliminary result)   Collection Time: 11/23/15  3:40 PM  Result Value Ref Range Status   Specimen Description BLOOD LEFT HAND  Final   Special Requests BOTTLES DRAWN AEROBIC AND  ANAEROBIC 5CC  Final   Culture NO GROWTH < 24 HOURS  Final   Report Status PENDING  Incomplete  Blood culture (routine x 2)     Status: None (Preliminary result)   Collection Time: 11/23/15  3:42 PM  Result Value Ref Range Status   Specimen Description BLOOD RIGHT ANTECUBITAL  Final   Special Requests BOTTLES DRAWN AEROBIC AND ANAEROBIC 5CC  Final   Culture NO GROWTH < 24 HOURS  Final   Report Status PENDING  Incomplete     Labs: BNP (last 3 results) No results for input(s): BNP in the last 8760 hours. Basic Metabolic Panel:  Recent Labs Lab 11/23/15 1542 11/24/15 0842  NA 136 139  K 3.6 3.8  CL 103 107  CO2 24 25  GLUCOSE 126* 101*  BUN 8 <5*  CREATININE 1.04 0.88  CALCIUM 9.3 8.9   Liver Function Tests: No results for input(s): AST, ALT, ALKPHOS, BILITOT, PROT, ALBUMIN in the last 168 hours. No results for input(s): LIPASE, AMYLASE in the last 168 hours. No results for input(s): AMMONIA in the last 168 hours. CBC:  Recent Labs Lab 11/23/15 1542 11/24/15 0842  WBC 14.6* 8.3  NEUTROABS 12.2*  --   HGB 15.7 14.1  HCT 45.1 41.5  MCV 93.4 93.0  PLT 301 293   Cardiac Enzymes: No results for input(s): CKTOTAL, CKMB, CKMBINDEX, TROPONINI in the last 168 hours. BNP: Invalid input(s): POCBNP CBG: No results for input(s): GLUCAP in the last 168 hours. D-Dimer No results for input(s): DDIMER in the last 72 hours. Hgb A1c No results for input(s): HGBA1C in the last 72 hours. Lipid Profile No results for input(s): CHOL, HDL, LDLCALC, TRIG, CHOLHDL, LDLDIRECT in the last 72 hours. Thyroid function studies No results for input(s): TSH, T4TOTAL, T3FREE, THYROIDAB in the last 72 hours.  Invalid input(s): FREET3 Anemia work up No results for input(s): VITAMINB12, FOLATE, FERRITIN, TIBC, IRON, RETICCTPCT in the last 72 hours. Urinalysis    Component Value Date/Time   LABSPEC 1.010 11/01/2014 1310   GLUCOSEU NEGATIVE 11/01/2014 1310   HGBUR MODERATE (A) 11/01/2014  1310   PROTEINUR NEGATIVE 11/01/2014 1310   Sepsis Labs Invalid input(s): PROCALCITONIN,  WBC,  LACTICIDVEN Microbiology Recent Results (from the past 240 hour(s))  Blood culture (routine x 2)     Status: None (Preliminary result)   Collection Time: 11/23/15  3:40 PM  Result Value Ref Range Status   Specimen Description BLOOD LEFT HAND  Final   Special Requests BOTTLES DRAWN AEROBIC AND ANAEROBIC 5CC  Final   Culture NO GROWTH < 24 HOURS  Final   Report Status PENDING  Incomplete  Blood culture (routine x 2)     Status: None (Preliminary result)   Collection Time: 11/23/15  3:42 PM  Result Value Ref Range Status   Specimen Description BLOOD RIGHT ANTECUBITAL  Final   Special Requests BOTTLES DRAWN AEROBIC AND ANAEROBIC 5CC  Final   Culture NO GROWTH < 24 HOURS  Final   Report Status PENDING  Incomplete     Time coordinating discharge: Over 30 minutes  SIGNED:   Jacquelin Hawking, MD  [facility] 11/24/2015, 4:07 PM Pager [phone]  If 7PM-7AM, please contact night-coverage [email or web address] Password [email or web address]

## 2015-11-24 NOTE — Progress Notes (Signed)
Reviewed discharge instruction with patient and patient verblized understanding. Iv removed and patient is waiting for his ride

## 2015-11-28 LAB — CULTURE, BLOOD (ROUTINE X 2)
CULTURE: NO GROWTH
CULTURE: NO GROWTH

## 2016-01-30 ENCOUNTER — Encounter (HOSPITAL_COMMUNITY)
Admission: EM | Disposition: A | Discharge status: Home / Self Care | Payer: Self-pay | Source: Home / Self Care | Attending: Emergency Medicine

## 2016-01-30 ENCOUNTER — Encounter (HOSPITAL_COMMUNITY): Payer: Self-pay | Admitting: Emergency Medicine

## 2016-01-30 ENCOUNTER — Ambulatory Visit (HOSPITAL_COMMUNITY)
Admission: EM | Admit: 2016-01-30 | Discharge: 2016-01-31 | Disposition: A | Discharge status: Home / Self Care | Payer: BLUE CROSS/BLUE SHIELD | Attending: Emergency Medicine | Admitting: Emergency Medicine

## 2016-01-30 ENCOUNTER — Emergency Department (HOSPITAL_COMMUNITY): Payer: BLUE CROSS/BLUE SHIELD | Admitting: Anesthesiology

## 2016-01-30 DIAGNOSIS — F17210 Nicotine dependence, cigarettes, uncomplicated: Secondary | ICD-10-CM | POA: Insufficient documentation

## 2016-01-30 DIAGNOSIS — Z885 Allergy status to narcotic agent status: Secondary | ICD-10-CM | POA: Insufficient documentation

## 2016-01-30 DIAGNOSIS — Z880 Allergy status to penicillin: Secondary | ICD-10-CM | POA: Insufficient documentation

## 2016-01-30 DIAGNOSIS — L02511 Cutaneous abscess of right hand: Secondary | ICD-10-CM | POA: Insufficient documentation

## 2016-01-30 DIAGNOSIS — S60521A Blister (nonthermal) of right hand, initial encounter: Secondary | ICD-10-CM

## 2016-01-30 DIAGNOSIS — L089 Local infection of the skin and subcutaneous tissue, unspecified: Secondary | ICD-10-CM

## 2016-01-30 HISTORY — PX: I & D EXTREMITY: SHX5045

## 2016-01-30 LAB — CBC WITH DIFFERENTIAL/PLATELET
Basophils Absolute: 0.1 10*3/uL (ref 0.0–0.1)
Basophils Relative: 1 %
EOS PCT: 2 %
Eosinophils Absolute: 0.2 10*3/uL (ref 0.0–0.7)
HCT: 42.7 % (ref 39.0–52.0)
Hemoglobin: 14.9 g/dL (ref 13.0–17.0)
LYMPHS ABS: 3 10*3/uL (ref 0.7–4.0)
LYMPHS PCT: 24 %
MCH: 31.6 pg (ref 26.0–34.0)
MCHC: 34.9 g/dL (ref 30.0–36.0)
MCV: 90.7 fL (ref 78.0–100.0)
MONO ABS: 0.8 10*3/uL (ref 0.1–1.0)
MONOS PCT: 6 %
Neutro Abs: 8.5 10*3/uL — ABNORMAL HIGH (ref 1.7–7.7)
Neutrophils Relative %: 67 %
PLATELETS: 237 10*3/uL (ref 150–400)
RBC: 4.71 MIL/uL (ref 4.22–5.81)
RDW: 12.2 % (ref 11.5–15.5)
WBC: 12.6 10*3/uL — ABNORMAL HIGH (ref 4.0–10.5)

## 2016-01-30 LAB — BASIC METABOLIC PANEL
Anion gap: 9 (ref 5–15)
BUN: 12 mg/dL (ref 6–20)
CO2: 23 mmol/L (ref 22–32)
Calcium: 9.2 mg/dL (ref 8.9–10.3)
Chloride: 102 mmol/L (ref 101–111)
Creatinine, Ser: 1.08 mg/dL (ref 0.61–1.24)
GFR calc Af Amer: >60 mL/min (ref >60)
GLUCOSE: 121 mg/dL — AB (ref 65–99)
POTASSIUM: 3.9 mmol/L (ref 3.5–5.1)
Sodium: 134 mmol/L — ABNORMAL LOW (ref 135–145)

## 2016-01-30 SURGERY — IRRIGATION AND DEBRIDEMENT EXTREMITY
Anesthesia: General | Site: Hand | Laterality: Right

## 2016-01-30 MED ORDER — PROPOFOL 10 MG/ML IV BOLUS
INTRAVENOUS | Status: AC
  Filled 2016-01-30: qty 20

## 2016-01-30 MED ORDER — PROPOFOL 10 MG/ML IV BOLUS
INTRAVENOUS | Status: DC | PRN
  Administered 2016-01-30: 200 mg via INTRAVENOUS

## 2016-01-30 MED ORDER — 0.9 % SODIUM CHLORIDE (POUR BTL) OPTIME
TOPICAL | Status: DC | PRN
  Administered 2016-01-30: 1000 mL

## 2016-01-30 MED ORDER — VANCOMYCIN HCL IN DEXTROSE 1-5 GM/200ML-% IV SOLN
1000.0000 mg | Freq: Once | INTRAVENOUS | Status: AC
  Administered 2016-01-30: 1000 mg via INTRAVENOUS
  Filled 2016-01-30: qty 200

## 2016-01-30 MED ORDER — SODIUM CHLORIDE 0.9 % IJ SOLN
INTRAMUSCULAR | Status: AC
  Filled 2016-01-30: qty 10

## 2016-01-30 MED ORDER — LIDOCAINE HCL (PF) 1 % IJ SOLN
5.0000 mL | Freq: Once | INTRAMUSCULAR | Status: AC
  Administered 2016-01-30: 5 mL
  Filled 2016-01-30: qty 5

## 2016-01-30 MED ORDER — FENTANYL CITRATE (PF) 100 MCG/2ML IJ SOLN
INTRAMUSCULAR | Status: DC | PRN
  Administered 2016-01-30 (×2): 50 ug via INTRAVENOUS

## 2016-01-30 MED ORDER — LIDOCAINE HCL (CARDIAC) 20 MG/ML IV SOLN
INTRAVENOUS | Status: DC | PRN
  Administered 2016-01-30: 100 mg via INTRAVENOUS

## 2016-01-30 MED ORDER — SUFENTANIL CITRATE 50 MCG/ML IV SOLN
INTRAVENOUS | Status: DC | PRN
  Administered 2016-01-30: 10 ug via INTRAVENOUS

## 2016-01-30 MED ORDER — MIDAZOLAM HCL 5 MG/5ML IJ SOLN
INTRAMUSCULAR | Status: DC | PRN
  Administered 2016-01-30: 2 mg via INTRAVENOUS

## 2016-01-30 MED ORDER — ONDANSETRON HCL 4 MG/2ML IJ SOLN
INTRAMUSCULAR | Status: DC | PRN
  Administered 2016-01-30: 4 mg via INTRAVENOUS

## 2016-01-30 MED ORDER — ONDANSETRON HCL 4 MG/2ML IJ SOLN
INTRAMUSCULAR | Status: AC
  Filled 2016-01-30: qty 2

## 2016-01-30 MED ORDER — LIDOCAINE 2% (20 MG/ML) 5 ML SYRINGE
INTRAMUSCULAR | Status: AC
  Filled 2016-01-30: qty 5

## 2016-01-30 MED ORDER — PROMETHAZINE HCL 25 MG/ML IJ SOLN: 6.2500 mg | INTRAMUSCULAR | Status: DC | PRN

## 2016-01-30 MED ORDER — FENTANYL CITRATE (PF) 100 MCG/2ML IJ SOLN: 25.0000 ug | INTRAMUSCULAR | Status: DC | PRN

## 2016-01-30 MED ORDER — SUCCINYLCHOLINE CHLORIDE 200 MG/10ML IV SOSY
PREFILLED_SYRINGE | INTRAVENOUS | Status: AC
  Filled 2016-01-30: qty 10

## 2016-01-30 MED ORDER — BUPIVACAINE HCL 0.25 % IJ SOLN
5.0000 mL | Freq: Once | INTRAMUSCULAR | Status: AC
  Administered 2016-01-30: 5 mL
  Filled 2016-01-30: qty 5

## 2016-01-30 MED ORDER — SULFAMETHOXAZOLE-TRIMETHOPRIM 800-160 MG PO TABS: 1.0000 | ORAL_TABLET | Freq: Two times a day (BID) | ORAL | 0 refills | Status: AC

## 2016-01-30 MED ORDER — ONDANSETRON HCL 4 MG PO TABS: 4.0000 mg | ORAL_TABLET | Freq: Three times a day (TID) | ORAL | 0 refills | Status: DC | PRN

## 2016-01-30 MED ORDER — LACTATED RINGERS IV SOLN
INTRAVENOUS | Status: DC | PRN
  Administered 2016-01-30: 23:00:00 via INTRAVENOUS

## 2016-01-30 MED ORDER — FENTANYL CITRATE (PF) 100 MCG/2ML IJ SOLN
INTRAMUSCULAR | Status: AC
  Filled 2016-01-30: qty 2

## 2016-01-30 MED ORDER — MIDAZOLAM HCL 2 MG/2ML IJ SOLN
INTRAMUSCULAR | Status: AC
  Filled 2016-01-30: qty 2

## 2016-01-30 MED ORDER — BUPIVACAINE HCL (PF) 0.25 % IJ SOLN
INTRAMUSCULAR | Status: DC | PRN
  Administered 2016-01-30: 10 mL

## 2016-01-30 MED ORDER — BUPIVACAINE HCL (PF) 0.25 % IJ SOLN
INTRAMUSCULAR | Status: AC
Start: 2016-01-30 — End: 2016-01-30
  Filled 2016-01-30: qty 30

## 2016-01-30 MED ORDER — HYDROCODONE-ACETAMINOPHEN 5-325 MG PO TABS: ORAL_TABLET | ORAL | 0 refills | Status: DC

## 2016-01-30 MED ORDER — SUFENTANIL CITRATE 50 MCG/ML IV SOLN
INTRAVENOUS | Status: AC
  Filled 2016-01-30: qty 1

## 2016-01-30 MED ORDER — SUCCINYLCHOLINE CHLORIDE 20 MG/ML IJ SOLN
INTRAMUSCULAR | Status: DC | PRN
  Administered 2016-01-30: 100 mg via INTRAVENOUS

## 2016-01-30 SURGICAL SUPPLY — 51 items
BANDAGE ACE 4X5 VEL STRL LF (GAUZE/BANDAGES/DRESSINGS) ×3 IMPLANT
BANDAGE COBAN STERILE 2 (GAUZE/BANDAGES/DRESSINGS) ×3 IMPLANT
BANDAGE ELASTIC 3 VELCRO ST LF (GAUZE/BANDAGES/DRESSINGS) ×3 IMPLANT
BNDG COHESIVE 1X5 TAN STRL LF (GAUZE/BANDAGES/DRESSINGS) IMPLANT
BNDG CONFORM 2 STRL LF (GAUZE/BANDAGES/DRESSINGS) IMPLANT
BNDG ESMARK 4X9 LF (GAUZE/BANDAGES/DRESSINGS) ×3 IMPLANT
BNDG GAUZE ELAST 4 BULKY (GAUZE/BANDAGES/DRESSINGS) ×3 IMPLANT
CORDS BIPOLAR (ELECTRODE) ×3 IMPLANT
COVER SURGICAL LIGHT HANDLE (MISCELLANEOUS) ×3 IMPLANT
DECANTER SPIKE VIAL GLASS SM (MISCELLANEOUS) ×3 IMPLANT
DRAIN PENROSE 1/4X12 LTX STRL (WOUND CARE) IMPLANT
DRSG ADAPTIC 3X8 NADH LF (GAUZE/BANDAGES/DRESSINGS) IMPLANT
DRSG EMULSION OIL 3X3 NADH (GAUZE/BANDAGES/DRESSINGS) IMPLANT
DRSG PAD ABDOMINAL 8X10 ST (GAUZE/BANDAGES/DRESSINGS) ×6 IMPLANT
DRSG TEGADERM 4X4.75 (GAUZE/BANDAGES/DRESSINGS) ×3 IMPLANT
GAUZE PACKING IODOFORM 1/4X15 (GAUZE/BANDAGES/DRESSINGS) ×3 IMPLANT
GAUZE SPONGE 4X4 12PLY STRL (GAUZE/BANDAGES/DRESSINGS) ×3 IMPLANT
GAUZE XEROFORM 1X8 LF (GAUZE/BANDAGES/DRESSINGS) ×3 IMPLANT
GLOVE BIO SURGEON STRL SZ7.5 (GLOVE) ×3 IMPLANT
GLOVE BIOGEL PI IND STRL 7.0 (GLOVE) ×1 IMPLANT
GLOVE BIOGEL PI IND STRL 8 (GLOVE) ×1 IMPLANT
GLOVE BIOGEL PI INDICATOR 7.0 (GLOVE) ×2
GLOVE BIOGEL PI INDICATOR 8 (GLOVE) ×2
GOWN STRL REUS W/ TWL LRG LVL3 (GOWN DISPOSABLE) ×1 IMPLANT
GOWN STRL REUS W/TWL LRG LVL3 (GOWN DISPOSABLE) ×2
KIT BASIN OR (CUSTOM PROCEDURE TRAY) ×3 IMPLANT
KIT ROOM TURNOVER OR (KITS) ×3 IMPLANT
LOOP VESSEL MAXI BLUE (MISCELLANEOUS) IMPLANT
MANIFOLD NEPTUNE II (INSTRUMENTS) ×3 IMPLANT
NEEDLE HYPO 25X1 1.5 SAFETY (NEEDLE) IMPLANT
NS IRRIG 1000ML POUR BTL (IV SOLUTION) ×3 IMPLANT
PACK ORTHO EXTREMITY (CUSTOM PROCEDURE TRAY) ×3 IMPLANT
PAD ARMBOARD 7.5X6 YLW CONV (MISCELLANEOUS) ×6 IMPLANT
SCRUB BETADINE 4OZ XXX (MISCELLANEOUS) ×3 IMPLANT
SET CYSTO W/LG BORE CLAMP LF (SET/KITS/TRAYS/PACK) ×3 IMPLANT
SOLUTION BETADINE 4OZ (MISCELLANEOUS) ×3 IMPLANT
SPLINT FINGER W/BULB (SOFTGOODS) ×3 IMPLANT
SPONGE LAP 4X18 X RAY DECT (DISPOSABLE) ×3 IMPLANT
SUT ETHILON 4 0 P 3 18 (SUTURE) IMPLANT
SUT ETHILON 4 0 PS 2 18 (SUTURE) ×3 IMPLANT
SUT MON AB 5-0 P3 18 (SUTURE) IMPLANT
SYR CONTROL 10ML LL (SYRINGE) IMPLANT
TOWEL OR 17X24 6PK STRL BLUE (TOWEL DISPOSABLE) ×3 IMPLANT
TOWEL OR 17X26 10 PK STRL BLUE (TOWEL DISPOSABLE) ×3 IMPLANT
TUBE ANAEROBIC SPECIMEN COL (MISCELLANEOUS) IMPLANT
TUBE CONNECTING 12'X1/4 (SUCTIONS) ×1
TUBE CONNECTING 12X1/4 (SUCTIONS) ×2 IMPLANT
TUBE FEEDING 5FR 15 INCH (TUBING) IMPLANT
UNDERPAD 30X30 (UNDERPADS AND DIAPERS) ×3 IMPLANT
WATER STERILE IRR 1000ML POUR (IV SOLUTION) ×3 IMPLANT
YANKAUER SUCT BULB TIP NO VENT (SUCTIONS) ×3 IMPLANT

## 2016-01-30 NOTE — Transfer of Care (Signed)
Immediate Anesthesia Transfer of Care Note  Patient: Liz MaladySimon Bonifield  Procedure(s) Performed: Procedure(s): IRRIGATION AND DEBRIDEMENT RIGHT INDEX FINGER (Right)  Patient Location: PACU  Anesthesia Type:General  Level of Consciousness: awake  Airway & Oxygen Therapy: Patient Spontanous Breathing and Patient connected to face mask oxygen  Post-op Assessment: Report given to RN and Post -op Vital signs reviewed and stable  Post vital signs: Reviewed and stable  Last Vitals:  Vitals:   01/30/16 1656  BP: 126/79  Pulse: 96  Resp: 21  Temp: 36.9 C    Last Pain:  Vitals:   01/30/16 1702  TempSrc:   PainSc: 9          Complications: No apparent anesthesia complications

## 2016-01-30 NOTE — ED Provider Notes (Signed)
[facility]-EMERGENCY DEPT Provider Note   CSN: [id number] Arrival date & time: 01/30/16  1648  By signing my name below, I, Phillis HaggisGabriella Gaje, attest that this documentation has been prepared under the direction and in the presence of [facility] Neese, NP-C. Electronically Signed: Phillis HaggisGabriella Gaje, ED Scribe. 01/30/16. 5:14 PM.  History   Chief Complaint Chief Complaint  Patient presents with  . Abscess   No language interpreter was used.  Abscess  Location:  Finger Finger abscess location:  R index finger Abscess quality: painful and redness   Abscess quality: not draining   Red streaking: no   Duration:  1 day Progression:  Worsening Pain details:    Quality:  Aching   Severity:  Mild   Timing:  Constant   Progression:  Worsening Chronicity:  New Ineffective treatments:  None tried Associated symptoms: no fever, no nausea and no vomiting   Risk factors: prior abscess   HPI Comments: Ronald Jefferson is a 40 y.o. male who presents to the Emergency Department complaining of a gradually worsening abscess to the right index finger onset one day ago. Pt said he first noticed what looked like a pimple to the area yesterday. It has now become larger, redder, and more painful. Pt has noticed the redness spreading up his hand. Pt reports hx of abscess in September 2017, for which he had to be admitted to the hospital. He has not tried anything for his symptoms. He denies fever, chills, nausea, or vomiting.  History reviewed. No pertinent past medical history.  Patient Active Problem List   Diagnosis Date Noted  . Cellulitis and abscess of oral soft tissues 11/23/2015  . Cellulitis 11/23/2015    Past Surgical History:  Procedure Laterality Date  . FRACTURE SURGERY         Home Medications    Prior to Admission medications   Medication Sig Start Date End Date Taking? Authorizing Provider  neomycin-bacitracin-polymyxin (NEOSPORIN) ointment Apply 1 application topically 2 (two) times daily as  needed for wound care. apply to eye    Historical Provider, MD    Family History History reviewed. No pertinent family history.  Social History Social History  Substance Use Topics  . Smoking status: Current Every Day Smoker    Packs/day: 0.50  . Smokeless tobacco: Never Used  . Alcohol use Yes     Comment: Occasional     Allergies   Amoxicillin and Toradol [ketorolac tromethamine]   Review of Systems Review of Systems  Constitutional: Negative for chills and fever.  Gastrointestinal: Negative for nausea and vomiting.  Skin: Positive for color change and wound.     Physical Exam Updated Vital Signs BP 126/79 (BP Location: Left Arm)   Pulse 96   Temp 98.4 F (36.9 C) (Oral)   Resp 21   SpO2 99%   Physical Exam  Constitutional: He is oriented to person, place, and time. He appears well-developed and well-nourished.  HENT:  Head: Normocephalic and atraumatic.  Eyes: Conjunctivae are normal.  Neck: Normal range of motion. Neck supple.  Musculoskeletal: Normal range of motion.  Radial pulse 2+ Adequate circulation Wound to dorsum of right hand at base of index finger with surrounding erythema, swelling present, and tender on palpation; redness extends from the base of the finger to the dorsum of the right hand, with tenderness over the hand  Neurological: He is alert and oriented to person, place, and time.  Skin: Skin is warm and dry. Capillary refill takes less than 2 seconds.  Psychiatric: He has a normal mood and affect. His behavior is normal.  Nursing note and vitals reviewed.  ED Treatments / Results  DIAGNOSTIC STUDIES: Oxygen Saturation is 99% on RA, normal by my interpretation.    COORDINATION OF CARE: 5:11 PM-Discussed treatment plan which includes labs and call to hand surgeon with pt at bedside and pt agreed to plan.   6:54 PM- performed digital block to left index finger. 2.5 CCs of lidocaine 1% w/o epi and 2.5 CCs of bupivacaine 0.25% w/o epi  used. Pt tolerated procedure well.   Labs (all labs ordered are listed, but only abnormal results are displayed) Labs Reviewed  CBC WITH DIFFERENTIAL/PLATELET - Abnormal; Notable for the following:       Result Value   WBC 12.6 (*)    Neutro Abs 8.5 (*)    All other components within normal limits  BASIC METABOLIC PANEL - Abnormal; Notable for the following:    Sodium 134 (*)    Glucose, Bld 121 (*)    All other components within normal limits   Radiology No results found.  Procedures .Nerve Block Date/Time: 01/30/2016 6:15 PM Performed by: Janne NapoleonNEESE, HOPE M Authorized by: Janne NapoleonNEESE, HOPE M   Consent:    Consent obtained:  Verbal   Consent given by:  Patient Indications:    Indications:  Pain relief Location:    Body area:  Upper extremity   Upper extremity nerve:  Metacarpal   Laterality:  Left Skin anesthesia (see MAR for exact dosages):    Skin anesthesia method:  Local infiltration   Local anesthetic:  Bupivacaine 0.25% w/o epi and lidocaine 1% w/o epi Procedure details (see MAR for exact dosages):    Block needle gauge:  25 G   Anesthetic injected:  Bupivacaine 0.25% w/o epi and lidocaine 1% w/o epi   Steroid injected:  None   Additive injected:  None   Injection procedure:  Anatomic landmarks identified and anatomic landmarks palpated Post-procedure details:    Dressing:  None   Outcome:  Pain relieved   Patient tolerance of procedure:  Tolerated well, no immediate complications    (including critical care time)  Medications Ordered in ED Medications  0.9 % irrigation (POUR BTL) (1,000 mLs Irrigation Given 01/30/16 2145)  vancomycin (VANCOCIN) IVPB 1000 mg/200 mL premix (0 mg Intravenous Stopped 01/30/16 1925)  lidocaine (PF) (XYLOCAINE) 1 % injection 5 mL (5 mLs Infiltration Given 01/30/16 1924)  bupivacaine (MARCAINE) 0.25 % (with pres) injection 5 mL (5 mLs Infiltration Given by Other 01/30/16 1924)     Initial Impression / Assessment and Plan / ED Course    I have reviewed the triage vital signs and the nursing notes.  Pertinent lab results that were available during my care of the patient were reviewed by me and considered in my medical decision making (see chart for details).  Clinical Course   40 y.o. male with pain, swelling and redness to the right index finger stable for await Dr. Merlyn LotKuzma to go to the OR.  Vancomycin 1 gram IV given prior to OR.   Final Clinical Impressions(s) / ED Diagnoses   Final diagnoses:  Blister of hand with infection, right, initial encounter  I personally performed the services described in this documentation, which was scribed in my presence. The recorded information has been reviewed and is accurate.   New Prescriptions New Prescriptions   No medications on file     [facility] Neese, NP 01/30/16 2201    Lyndal Pulleyaniel Knott, MD  01/30/16 2215  

## 2016-01-30 NOTE — Op Note (Signed)
[id number] 

## 2016-01-30 NOTE — Anesthesia Procedure Notes (Addendum)
Procedure Name: Intubation Date/Time: 01/30/2016 10:49 PM Performed by: Josuha Fontanez S Pre-anesthesia Checklist: Patient identified, Emergency Drugs available, Suction available, Patient being monitored and Timeout performed Patient Re-evaluated:Patient Re-evaluated prior to inductionOxygen Delivery Method: Circle system utilized Preoxygenation: Pre-oxygenation with 100% oxygen Intubation Type: IV induction, Rapid sequence and Cricoid Pressure applied Ventilation: Mask ventilation without difficulty Grade View: Grade I Tube type: Oral Tube size: 7.5 mm Number of attempts: 1 Airway Equipment and Method: Stylet Placement Confirmation: ETT inserted through vocal cords under direct vision,  positive ETCO2 and breath sounds checked- equal and bilateral Secured at: 22 cm Tube secured with: Tape Dental Injury: Teeth and Oropharynx as per pre-operative assessment

## 2016-01-30 NOTE — Discharge Instructions (Signed)

## 2016-01-30 NOTE — Anesthesia Preprocedure Evaluation (Addendum)
Anesthesia Evaluation  Patient identified by MRN, date of birth, ID band Patient awake    Reviewed: Allergy & Precautions, NPO status , Patient's Chart, lab work & pertinent test results  Airway Mallampati: II  TM Distance: >3 FB Neck ROM: Full    Dental  (+) Teeth Intact, Dental Advisory Given, Chipped   Pulmonary Current Smoker,    Pulmonary exam normal breath sounds clear to auscultation       Cardiovascular Exercise Tolerance: Good negative cardio ROS Normal cardiovascular exam Rhythm:Regular Rate:Normal     Neuro/Psych negative neurological ROS     GI/Hepatic negative GI ROS, Neg liver ROS,   Endo/Other  negative endocrine ROS  Renal/GU negative Renal ROS     Musculoskeletal Right finger abscess   Abdominal   Peds  Hematology negative hematology ROS (+)   Anesthesia Other Findings Day of surgery medications reviewed with the patient.  Reproductive/Obstetrics                            Anesthesia Physical Anesthesia Plan  ASA: II and emergent  Anesthesia Plan: General   Post-op Pain Management:    Induction: Intravenous, Rapid sequence and Cricoid pressure planned  Airway Management Planned: Oral ETT  Additional Equipment:   Intra-op Plan:   Post-operative Plan: Extubation in OR  Informed Consent: I have reviewed the patients History and Physical, chart, labs and discussed the procedure including the risks, benefits and alternatives for the proposed anesthesia with the patient or authorized representative who has indicated his/her understanding and acceptance.   Dental advisory given  Plan Discussed with: CRNA  Anesthesia Plan Comments: (Risks/benefits of general anesthesia discussed with patient including risk of damage to teeth, lips, gum, and tongue, nausea/vomiting, allergic reactions to medications, and the possibility of heart attack, stroke and death.  All patient  questions answered.  Patient wishes to proceed.  Patient ate at 1500.  Will proceed with RSI, GETA.)       Anesthesia Quick Evaluation

## 2016-01-30 NOTE — ED Triage Notes (Signed)
Pt sts abscess to 2nd digit on left hand with redness and pain

## 2016-01-30 NOTE — H&P (Signed)
Ronald Jefferson is an 40 y.o. male.   Chief Complaint: right index finger abscess HPI: 40 yo rhd male states he began to have problems with right index finger over past day and a half.  Has had increasing pain, swelling, erythema of right index finger.  He describes a throbbing/burning pain of 10/10 severity.  Alleviated by nothing and aggravated by any contact.  He has had fevers and chills.  Case discussed with [facility] [place] and her note from 01/30/2016 reviewed. Xrays viewed and interpreted by me: none Labs reviewed: WBC 12.6  Allergies:  Allergies  Allergen Reactions  . Amoxicillin Anaphylaxis  . Toradol [Ketorolac Tromethamine] Anaphylaxis    History reviewed. No pertinent past medical history.  Past Surgical History:  Procedure Laterality Date  . FRACTURE SURGERY      Family History: History reviewed. No pertinent family history.  Social History:   reports that he has been smoking.  He has been smoking about 0.50 packs per day. He has never used smokeless tobacco. He reports that he drinks alcohol. He reports that he does not use drugs.  Medications:  (Not in a hospital admission)  Results for orders placed or performed during the hospital encounter of 01/30/16 (from the past 48 hour(s))  CBC with Differential/Platelet     Status: Abnormal   Collection Time: 01/30/16  5:39 PM  Result Value Ref Range   WBC 12.6 (H) 4.0 - 10.5 K/uL   RBC 4.71 4.22 - 5.81 MIL/uL   Hemoglobin 14.9 13.0 - 17.0 g/dL   HCT 42.7 39.0 - 52.0 %   MCV 90.7 78.0 - 100.0 fL   MCH 31.6 26.0 - 34.0 pg   MCHC 34.9 30.0 - 36.0 g/dL   RDW 12.2 11.5 - 15.5 %   Platelets 237 150 - 400 K/uL   Neutrophils Relative % 67 %   Neutro Abs 8.5 (H) 1.7 - 7.7 K/uL   Lymphocytes Relative 24 %   Lymphs Abs 3.0 0.7 - 4.0 K/uL   Monocytes Relative 6 %   Monocytes Absolute 0.8 0.1 - 1.0 K/uL   Eosinophils Relative 2 %   Eosinophils Absolute 0.2 0.0 - 0.7 K/uL   Basophils Relative 1 %   Basophils Absolute 0.1  0.0 - 0.1 K/uL  Basic metabolic panel     Status: Abnormal   Collection Time: 01/30/16  5:39 PM  Result Value Ref Range   Sodium 134 (L) 135 - 145 mmol/L   Potassium 3.9 3.5 - 5.1 mmol/L   Chloride 102 101 - 111 mmol/L   CO2 23 22 - 32 mmol/L   Glucose, Bld 121 (H) 65 - 99 mg/dL   BUN 12 6 - 20 mg/dL   Creatinine, Ser 1.08 0.61 - 1.24 mg/dL   Calcium 9.2 8.9 - 10.3 mg/dL   GFR calc non Af Amer >60 >60 mL/min   GFR calc Af Amer >60 >60 mL/min    Comment: (NOTE) The eGFR has been calculated using the CKD EPI equation. This calculation has not been validated in all clinical situations. eGFR's persistently <60 mL/min signify possible Chronic Kidney Disease.    Anion gap 9 5 - 15    No results found.   A comprehensive review of systems was negative except for: Constitutional: positive for chills and fevers Gastrointestinal: positive for nausea Neurological: positive for headaches   Blood pressure 126/79, pulse 96, temperature 98.4 F (36.9 C), temperature source Oral, resp. rate 21, SpO2 99 %.  General appearance: alert, cooperative and  appears stated age Head: Normocephalic, without obvious abnormality, atraumatic Neck: supple, symmetrical, trachea midline Resp: clear to auscultation bilaterally Cardio: regular rate and rhythm GI: non-tender Extremities: Intact sensation and capillary refill all digits.  +epl/fpl/io.  Small wound on dorsum right index with swelling and erythema surrounding.  No proximal streaking.  ttp on dorsum of finger.  Some tenderness volarly, but most is dorsal.  No erythema or swelling volarly. Pulses: 2+ and symmetric Skin: Skin color, texture, turgor normal. No rashes or lesions Neurologic: Grossly normal Incision/Wound: As above  Assessment/Plan Right index finger abscess.  Recommend incision and drainage in OR.  Risks, benefits, and alternatives of surgery were discussed and the patient agrees with the plan of care.   Berma Harts  R 01/30/2016, 10:17 PM

## 2016-01-30 NOTE — Brief Op Note (Signed)
01/30/2016  11:13 PM  PATIENT:  Ronald MaladySimon Jefferson  40 y.o. male  PRE-OPERATIVE DIAGNOSIS:  Abscess right index finger  POST-OPERATIVE DIAGNOSIS:  Abscess Right Index finger  PROCEDURE:  Procedure(s): IRRIGATION AND DEBRIDEMENT RIGHT INDEX FINGER (Right)  SURGEON:  Surgeon(s) and Role:    * Betha LoaKevin Salvator Seppala, MD - Primary  PHYSICIAN ASSISTANT:   ASSISTANTS: none   ANESTHESIA:   general  EBL:  Total I/O In: 700 [I.V.:500; IV Piggyback:200] Out: 5 [Blood:5]  BLOOD ADMINISTERED:none  DRAINS: iodoform packing  LOCAL MEDICATIONS USED:  MARCAINE     SPECIMEN:  Source of Specimen:  right index finger  DISPOSITION OF SPECIMEN:  micro  COUNTS:  YES  TOURNIQUET:   Total Tourniquet Time Documented: Upper Arm (laterality) - 11 minutes Total: Upper Arm (laterality) - 11 minutes   DICTATION: .Other Dictation: Dictation Number [id number]  PLAN OF CARE: Discharge to home after PACU  PATIENT DISPOSITION:  PACU - hemodynamically stable.

## 2016-01-31 ENCOUNTER — Encounter (HOSPITAL_COMMUNITY): Payer: Self-pay | Admitting: Orthopedic Surgery

## 2016-01-31 MED ORDER — DIPHENHYDRAMINE HCL 50 MG/ML IJ SOLN
12.5000 mg | Freq: Once | INTRAMUSCULAR | Status: AC
  Administered 2016-01-31: 12.5 mg via INTRAVENOUS

## 2016-01-31 MED ORDER — DIPHENHYDRAMINE HCL 50 MG/ML IJ SOLN
INTRAMUSCULAR | Status: AC
  Filled 2016-01-31: qty 1

## 2016-01-31 NOTE — Progress Notes (Signed)
D;Discharge instruction given to Pt.& Steffanie RainwaterFiancee, pt [place] and he told the Dr. If he can f/u  Dr. Marland Kitchen@ [facility], told pt and Steffanie RainwaterFiancee to call in am to office make sure talk to Dr. Merlyn LotKuzma. Itching is better after Benadryl shot. DC via wheelchair .

## 2016-01-31 NOTE — Op Note (Signed)
NAMMaye Hides:  Jefferson, Ronald                 ACCOUNT NO.:  [id number]  MEDICAL RECORD NO.:  [id number]  LOCATION:  [facility]                         FACILITY:  [facility]  PHYSICIAN:  Betha LoaKevin Tami Barren, MD        DATE OF BIRTH:  Jul 24, 1975  DATE OF PROCEDURE:  01/30/2016 DATE OF DISCHARGE:  01/31/2016                              OPERATIVE REPORT   PREOPERATIVE DIAGNOSIS:  Right index finger abscess.  POSTOPERATIVE DIAGNOSIS:  Right index finger abscess.  PROCEDURE:  Incision and drainage of right index finger.  SURGEON:  Betha LoaKevin Afia Messenger, MD.  ASSISTANT:  None.  ANESTHESIA:  General.  IV FLUIDS:  Per anesthesia flow sheet.  ESTIMATED BLOOD LOSS:  Minimal.  COMPLICATIONS:  None.  SPECIMENS:  Cultures to Micro.  TOURNIQUET TIME:  11 minutes.  DISPOSITION:  Stable to PACU.  INDICATIONS:  [name] is a 40 year old male, who has noted increasing pain, swelling, and erythema of his right index finger over the past day and half.  He has had some fevers, he states.  He was seen in the emergency department, where he was evaluated and felt to have an abscess to the index finger.  I was consulted for management of the condition. I recommended incision and drainage in the operating room.  Risks, benefits, and alternatives of surgery were discussed including risk of blood loss; infection; damage to nerves, vessels, tendons, ligaments, bone; failure of surgery; need for additional surgery; complications with wound healing; continued pain; continued infection; need for repeat irrigation and debridement.  He voiced understanding of these risks and elected to proceed.  OPERATIVE COURSE:  After being identified preoperatively by myself, the patient and I agreed upon procedure and site of procedure.  Surgical site was marked.  The risks, benefits, and alternatives of surgery were reviewed and he wished to proceed.  Surgical consent had been signed. He was given vancomycin in the emergency department.  He was  transferred to the operating room and placed on the operating room table in supine position with the right upper extremity on arm board.  General anesthesia induced by Anesthesiology.  Right upper extremity was prepped and draped in normal sterile orthopedic fashion.  Surgical pause was performed between surgeons, anesthesia, and operating staff; and all were in agreement as to the patient, procedure, and site of procedure. Tourniquet at the proximal aspect of the extremity was inflated to 250 mmHg after exsanguination of the limb with an Esmarch bandage.  Incision was made on the dorsum of the index finger including the small draining wound.  This was carried into subcutaneous tissues by spreading technique.  Gross purulence was encountered.  Cultures were taken for aerobes and anaerobes.  The wound was explored.  There was greenish purulence within it.  This was debrided sharply with a knife including the skin and subcutaneous tissues.  This infected and devitalized tissue was removed.  Once all infection and devitalized tissue was removed, the wound was copiously irrigated with sterile saline.  It was then packed with quarter-inch iodoform gauze.  The infection was superficial to the extensor tendon.  There did not appear to be an infection deep to the extensor tendon.  A  digital block was performed with 10 mL of 0.25% plain Marcaine to aid in postoperative analgesia.  The wound was dressed with sterile 4x4s and wrapped with a Kerlix bandage.  An AlumaFoam splint was placed and wrapped lightly with a Coban dressing.  Tourniquet was deflated at 11 minutes.  Fingertips were pink with brisk capillary refill after deflation of tourniquet.  The operative drapes were broken down, and the patient was awoken from anesthesia safely.  He was transferred back to stretcher and taken to PACU in stable condition.  I will see him back in the office in approximately 3 days to 4 days.  He states he may  be going out of town for the next week in which case he is going to seek a wound care center where he is.  He states he is going back home and has a long-term family physician, who can help him out. If he has any issues, he will help him to dress it.  I will give him a prescription for Norco 5/325 one to two p.o. q.6 hours p.r.n. pain, dispensed #20; Zofran 4 mg p.o. q.8 hours p.r.n. nausea, dispensed #20; and Bactrim DS 1 p.o. b.i.d. x7 days.     Betha LoaKevin Monnica Saltsman, MD     KK/MEDQ  D:  01/30/2016  T:  01/31/2016  Job:  [id number]

## 2016-02-02 NOTE — Anesthesia Postprocedure Evaluation (Signed)
Anesthesia Post Note  Patient: Liz MaladySimon Mitter  Procedure(s) Performed: Procedure(s) (LRB): IRRIGATION AND DEBRIDEMENT RIGHT INDEX FINGER (Right)  Patient location during evaluation: PACU Anesthesia Type: General Level of consciousness: awake and alert Pain management: pain level controlled Vital Signs Assessment: post-procedure vital signs reviewed and stable Respiratory status: spontaneous breathing, nonlabored ventilation, respiratory function stable and patient connected to nasal cannula oxygen Cardiovascular status: blood pressure returned to baseline and stable Postop Assessment: no signs of nausea or vomiting Anesthetic complications: no    Last Vitals:  Vitals:   01/30/16 2356 01/31/16 0017  BP: (!) 132/94   Pulse: 74 74  Resp: 17 17  Temp:  36.3 C    Last Pain:  Vitals:   01/30/16 1702  TempSrc:   PainSc: 9                  Cecile HearingStephen Edward Brunilda Eble

## 2016-02-05 LAB — AEROBIC/ANAEROBIC CULTURE W GRAM STAIN (SURGICAL/DEEP WOUND): Gram Stain: NONE SEEN

## 2016-02-05 LAB — AEROBIC/ANAEROBIC CULTURE (SURGICAL/DEEP WOUND)

## 2016-04-22 ENCOUNTER — Encounter (HOSPITAL_COMMUNITY): Payer: Self-pay | Admitting: *Deleted

## 2016-04-22 ENCOUNTER — Emergency Department (HOSPITAL_COMMUNITY)
Admission: EM | Admit: 2016-04-22 | Discharge: 2016-04-22 | Disposition: A | Discharge status: Home / Self Care | Payer: BLUE CROSS/BLUE SHIELD | Attending: Emergency Medicine | Admitting: Emergency Medicine

## 2016-04-22 DIAGNOSIS — L089 Local infection of the skin and subcutaneous tissue, unspecified: Secondary | ICD-10-CM | POA: Insufficient documentation

## 2016-04-22 DIAGNOSIS — F17200 Nicotine dependence, unspecified, uncomplicated: Secondary | ICD-10-CM | POA: Insufficient documentation

## 2016-04-22 DIAGNOSIS — L02512 Cutaneous abscess of left hand: Secondary | ICD-10-CM | POA: Diagnosis present

## 2016-04-22 HISTORY — DX: Methicillin resistant Staphylococcus aureus infection, unspecified site: A49.02

## 2016-04-22 MED ORDER — DOXYCYCLINE HYCLATE 100 MG PO CAPS: 100.0000 mg | ORAL_CAPSULE | Freq: Two times a day (BID) | ORAL | 0 refills | Status: AC

## 2016-04-22 MED ORDER — DOXYCYCLINE HYCLATE 100 MG PO TABS
100.0000 mg | ORAL_TABLET | Freq: Once | ORAL | Status: AC
  Administered 2016-04-22: 100 mg via ORAL
  Filled 2016-04-22: qty 1

## 2016-04-22 NOTE — Discharge Instructions (Signed)
Take your antibiotic as prescribed. I recommend continuing to take your prescription of Bactrim as prescribed until completed. I recommend following up with your primary care provider returning to the ED within the next 48 hours for wound recheck. Please return to the Emergency Department if symptoms worsen or new onset of fever, vomiting, worsening swelling/redness, drainage, decreased range of motion.

## 2016-04-22 NOTE — ED Triage Notes (Signed)
Pt reports hx of MRSA infections multiple times in past 3 months. Hx of surgyer to his fingers due to surgery. Reports abscess and pain to left middle finger .wound noted but no redness or swellng.

## 2016-04-22 NOTE — ED Provider Notes (Signed)
[facility] Provider Note   CSN: [id number] Arrival date & time: 04/22/16  1453  By signing my name below, I, Ronald Jefferson, attest that this documentation has been prepared under the direction and in the presence of Melburn Hake, [place]. Electronically Signed: Linna Jefferson, Scribe. 04/22/2016. 6:13 PM.  History   Chief Complaint Chief Complaint  Patient presents with  . Abscess    The history is provided by the patient. No language interpreter was used.     HPI Comments: Ronald Jefferson is a 41 y.o. male with PMHx of MRSA and cellulitis who presents to the Emergency Department complaining of a gradually worsening area of pain and redness to his left middle finger for 8-9 days. Pt states the area worsened significantly today. He notes the area began as a "pimple head" and has progressively gotten larger. He reports some spontaneous drainage from the area which he states is usually clear but occasionally pus-like. He has been applying mupirocin ointment to the area with no improvement. Pt notes he was prescribed Bactrim by his PCP four days ago for MRSA which he was dx with in his PCP's office prior to onset of wound due to hx of recurrent skin infections. Pt has additionally been taking Hibiclens showers and applying clean bandages with no improvement. No medications taken for his finger pain. No trauma to his left middle finger. He reports some nausea today but no vomiting. He notes this is his third MRSA eruption in the past 3 months (others in right finger and right forearm) and his current eruption is more "open" than the previous two. Pt works as a Theatre stage manager and has no known sick contacts. No h/o immunocompromised conditions or IV drug use. He denies fever, numbness/tingling, joint swelling, or any other associated symptoms.  Past Medical History:  Diagnosis Date  . MRSA (methicillin resistant Staphylococcus aureus)     Patient Active Problem List   Diagnosis Date Noted  .  Cellulitis and abscess of oral soft tissues 11/23/2015  . Cellulitis 11/23/2015    Past Surgical History:  Procedure Laterality Date  . FRACTURE SURGERY    . I&D EXTREMITY Right 01/30/2016   Procedure: IRRIGATION AND DEBRIDEMENT RIGHT INDEX FINGER;  Surgeon: Betha Loa, MD;  Location: [facility] OR;  Service: Orthopedics;  Laterality: Right;       Home Medications    Prior to Admission medications   Medication Sig Start Date End Date Taking? Authorizing Provider  doxycycline (VIBRAMYCIN) 100 MG capsule Take 1 capsule (100 mg total) by mouth 2 (two) times daily. 04/22/16   Barrett Henle, PA-C  HYDROcodone-acetaminophen [facility]) 5-325 MG tablet 1-2 tabs po q6 hours prn pain 01/30/16   Betha Loa, MD  neomycin-bacitracin-polymyxin (NEOSPORIN) ointment Apply 1 application topically 2 (two) times daily as needed for wound care. apply to eye    Historical Provider, MD  ondansetron (ZOFRAN) 4 MG tablet Take 1 tablet (4 mg total) by mouth every 8 (eight) hours as needed for nausea or vomiting. 01/30/16   Betha Loa, MD  sulfamethoxazole-trimethoprim (BACTRIM DS) 800-160 MG tablet Take 1 tablet by mouth 2 (two) times daily. 01/30/16   Betha Loa, MD    Family History History reviewed. No pertinent family history.  Social History Social History  Substance Use Topics  . Smoking status: Current Every Day Smoker    Packs/day: 0.50  . Smokeless tobacco: Never Used  . Alcohol use Yes     Comment: Occasional     Allergies   Amoxicillin and  Toradol [ketorolac tromethamine]   Review of Systems Review of Systems  Gastrointestinal: Positive for nausea. Negative for vomiting.  Musculoskeletal: Positive for myalgias. Negative for joint swelling.  Skin: Positive for wound.  Neurological: Negative for numbness.     Physical Exam Updated Vital Signs BP 124/79 (BP Location: Right Arm)   Pulse 73   Temp 98.4 F (36.9 C) (Oral)   Resp 18   SpO2 100%   Physical Exam    Constitutional: He is oriented to person, place, and time. He appears well-developed and well-nourished.  HENT:  Head: Normocephalic and atraumatic.  Mouth/Throat: Uvula is midline, oropharynx is clear and moist and mucous membranes are normal. No oral lesions. No oropharyngeal exudate, posterior oropharyngeal edema, posterior oropharyngeal erythema or tonsillar abscesses.  Eyes: Conjunctivae and EOM are normal. Right eye exhibits no discharge. Left eye exhibits no discharge. No scleral icterus.  Cardiovascular: Normal rate, regular rhythm, normal heart sounds and intact distal pulses.   Pulmonary/Chest: Effort normal and breath sounds normal. No respiratory distress. He has no wheezes. He has no rales. He exhibits no tenderness.  Abdominal: Soft. Bowel sounds are normal. He exhibits no distension and no mass. There is no tenderness. There is no rebound and no guarding. No hernia.  Musculoskeletal: He exhibits no edema.  FROM of left hand, wrist, and arm with 5/5 strength. Equal grip strength bilaterally. 2+ radial pulse. Sensation grossly intact. Cap refill < 2.  Neurological: He is alert and oriented to person, place, and time.  Skin: Skin is warm and dry.  1 cm superficial open wound noted to left third distal phalanx with very mild surrounding erythema. No surrounding swelling, induration, fluctuance, drainage, or warmth.  Nursing note and vitals reviewed.        ED Treatments / Results  Labs (all labs ordered are listed, but only abnormal results are displayed) Labs Reviewed - No data to display  EKG  EKG Interpretation None       Radiology No results found.  Procedures Procedures (including critical care time)  DIAGNOSTIC STUDIES: Oxygen Saturation is 100% on RA, normal by my interpretation.    COORDINATION OF CARE: 6:25 PM Discussed treatment plan with pt at bedside and pt agreed to plan.  Medications Ordered in ED Medications  doxycycline (VIBRA-TABS) tablet  100 mg (100 mg Oral Given 04/22/16 1902)     Initial Impression / Assessment and Plan / ED Course  I have reviewed the triage vital signs and the nursing notes.  Pertinent labs & imaging results that were available during my care of the patient were reviewed by me and considered in my medical decision making (see chart for details).     Patient presents with left finger skin infection which has gradually worsened over the last week. Reports history of recurrent skin infections and states he has been admitted twice over the past 6 months for infections/abscesses to his right upper extremity. He notes he was recently diagnosed with MRSA via nose swab in his PCPs office and was started on Bactrim 4 days ago. Denies fever. VSS. Exam revealed 1 cm superficial open wound noted to left third distal phalanx with very mild surrounding erythema. No surrounding swelling, induration, fluctuance, drainage, or warmth. No evidence of abscess or cellulitis. Discussed pt with Dr. Erma Heritage. Due to patient with history of recurrent skin infections and known MRSA, plan to start patient on doxycycline and addition to Bactrim for broader strep coverage in additional MRSA coverage. No recent use of steroids or  other immunosuppressive medications; no Hx of diabetes.  Pt is without gross abscess for which I&D would be possible.  Pt is alert, oriented, NAD, afebrile, non tachycardic, nonseptic and nontoxic appearing.  Advised patient to return to the ED or be seen by her PCP within the next 48 hours for wound recheck. Patient reports understanding and agreement. Discussed return precautions.  Final Clinical Impressions(s) / ED Diagnoses   Final diagnoses:  Finger infection    New Prescriptions Discharge Medication List as of 04/22/2016  6:53 PM    START taking these medications   Details  doxycycline (VIBRAMYCIN) 100 MG capsule Take 1 capsule (100 mg total) by mouth 2 (two) times daily., Starting Wed 04/22/2016, Print         I personally performed the services described in this documentation, which was scribed in my presence. The recorded information has been reviewed and is accurate.    Satira Sarkicole Elizabeth FarrellNadeau, [place]-C 04/22/16 1913    Shaune Pollackameron Isaacs, MD 04/23/16 [phone]

## 2016-04-22 NOTE — ED Notes (Signed)
Papers reviewed with patient and he verbalizes understanding. ABX first dose administered

## 2016-04-24 ENCOUNTER — Emergency Department (HOSPITAL_COMMUNITY): Payer: BLUE CROSS/BLUE SHIELD

## 2016-04-24 ENCOUNTER — Encounter (HOSPITAL_COMMUNITY): Payer: Self-pay | Admitting: *Deleted

## 2016-04-24 ENCOUNTER — Emergency Department (HOSPITAL_COMMUNITY)
Admission: EM | Admit: 2016-04-24 | Discharge: 2016-04-24 | Disposition: A | Discharge status: Home / Self Care | Payer: BLUE CROSS/BLUE SHIELD | Attending: Emergency Medicine | Admitting: Emergency Medicine

## 2016-04-24 DIAGNOSIS — Z8614 Personal history of Methicillin resistant Staphylococcus aureus infection: Secondary | ICD-10-CM | POA: Insufficient documentation

## 2016-04-24 DIAGNOSIS — F17200 Nicotine dependence, unspecified, uncomplicated: Secondary | ICD-10-CM | POA: Diagnosis not present

## 2016-04-24 DIAGNOSIS — X58XXXD Exposure to other specified factors, subsequent encounter: Secondary | ICD-10-CM | POA: Diagnosis not present

## 2016-04-24 DIAGNOSIS — S61203D Unspecified open wound of left middle finger without damage to nail, subsequent encounter: Secondary | ICD-10-CM | POA: Insufficient documentation

## 2016-04-24 DIAGNOSIS — S61209D Unspecified open wound of unspecified finger without damage to nail, subsequent encounter: Secondary | ICD-10-CM

## 2016-04-24 DIAGNOSIS — Z79899 Other long term (current) drug therapy: Secondary | ICD-10-CM | POA: Diagnosis not present

## 2016-04-24 DIAGNOSIS — L089 Local infection of the skin and subcutaneous tissue, unspecified: Secondary | ICD-10-CM | POA: Diagnosis present

## 2016-04-24 LAB — URINALYSIS, ROUTINE W REFLEX MICROSCOPIC
Bilirubin Urine: NEGATIVE
Glucose, UA: NEGATIVE mg/dL
HGB URINE DIPSTICK: NEGATIVE
Ketones, ur: NEGATIVE mg/dL
Leukocytes, UA: NEGATIVE
Nitrite: NEGATIVE
Protein, ur: NEGATIVE mg/dL
SPECIFIC GRAVITY, URINE: 1.013 (ref 1.005–1.030)
pH: 6 (ref 5.0–8.0)

## 2016-04-24 LAB — CBC WITH DIFFERENTIAL/PLATELET
Basophils Absolute: 0.1 10*3/uL (ref 0.0–0.1)
Basophils Relative: 1 %
EOS ABS: 0.1 10*3/uL (ref 0.0–0.7)
Eosinophils Relative: 1 %
HEMATOCRIT: 45.8 % (ref 39.0–52.0)
HEMOGLOBIN: 15.8 g/dL (ref 13.0–17.0)
LYMPHS PCT: 29 %
Lymphs Abs: 2.7 10*3/uL (ref 0.7–4.0)
MCH: 31.7 pg (ref 26.0–34.0)
MCHC: 34.5 g/dL (ref 30.0–36.0)
MCV: 92 fL (ref 78.0–100.0)
Monocytes Absolute: 0.4 10*3/uL (ref 0.1–1.0)
Monocytes Relative: 4 %
NEUTROS ABS: 6.1 10*3/uL (ref 1.7–7.7)
NEUTROS PCT: 65 %
Platelets: 317 10*3/uL (ref 150–400)
RBC: 4.98 MIL/uL (ref 4.22–5.81)
RDW: 12.5 % (ref 11.5–15.5)
WBC: 9.3 10*3/uL (ref 4.0–10.5)

## 2016-04-24 LAB — COMPREHENSIVE METABOLIC PANEL
ALBUMIN: 4.4 g/dL (ref 3.5–5.0)
ALT: 21 U/L (ref 17–63)
ANION GAP: 9 (ref 5–15)
AST: 28 U/L (ref 15–41)
Alkaline Phosphatase: 51 U/L (ref 38–126)
BUN: 7 mg/dL (ref 6–20)
CHLORIDE: 102 mmol/L (ref 101–111)
CO2: 24 mmol/L (ref 22–32)
Calcium: 9.6 mg/dL (ref 8.9–10.3)
Creatinine, Ser: 1.06 mg/dL (ref 0.61–1.24)
GFR calc non Af Amer: >60 mL/min (ref >60)
Glucose, Bld: 104 mg/dL — ABNORMAL HIGH (ref 65–99)
Potassium: 4 mmol/L (ref 3.5–5.1)
SODIUM: 135 mmol/L (ref 135–145)
Total Bilirubin: 0.4 mg/dL (ref 0.3–1.2)
Total Protein: 7.3 g/dL (ref 6.5–8.1)

## 2016-04-24 LAB — I-STAT CG4 LACTIC ACID, ED
LACTIC ACID, VENOUS: 1.94 mmol/L — AB (ref 0.5–1.9)
Lactic Acid, Venous: 1.44 mmol/L (ref 0.5–1.9)

## 2016-04-24 NOTE — ED Notes (Signed)
Pt stated that he cannot void at this time.  Pt has specimen cup and instructions

## 2016-04-24 NOTE — Discharge Instructions (Signed)
You were seen in the ED today with a finger wound. There is no fluid to drain at this time. Continue your antibiotics and follow up with the infectious disease doctors as scheduled. Return to the Emergency Department with any sudden worsening of symptoms including sudden finger swelling, redness, or fever. You can follow with Dr. Merlyn LotKuzma as needed if symptoms worsen.

## 2016-04-24 NOTE — ED Notes (Signed)
ED Provider at bedside. 

## 2016-04-24 NOTE — ED Triage Notes (Signed)
Pt states this is his 3rd case of MRSA in the last 6 months.  He is presently on on bactrim for an infection to the middle digit of his L hand.  He came in on Wed b/c the bactrim wasn't working and was given oral doxy, but the infection is not getting better.  Pt states he is weak and fatigued and also c/o chills.

## 2016-04-24 NOTE — ED Notes (Signed)
Patient transported to X-ray 

## 2016-04-24 NOTE — ED Provider Notes (Signed)
Emergency Department Provider Note   I have reviewed the triage vital signs and the nursing notes.   HISTORY  Chief Complaint Recurrent Skin Infections (MRSA)   HPI Ronald Jefferson is a 41 y.o. male with PMH of MRSA infection requiring drainage in the past presents to the ED with left finger wound. The wound has been present for the past week. The patient has been taking Bactrim initially and then was switched to doxycycline 2 days ago. He notes drainage from the wound and pain in the finger. He denies any fever, shaking chills but is experiencing some pain in the finger. No radiation. No difficulty with movement. He has had prior areas on his hands with similar infection that had to be drained but states this one is different in that his are draining does not seem to be accumulating fluid. No injury to the finger. He has an appointment scheduled with infectious disease in March and has seen Dr. Merlyn Lot in the past.    Past Medical History:  Diagnosis Date  . MRSA (methicillin resistant Staphylococcus aureus)     Patient Active Problem List   Diagnosis Date Noted  . Cellulitis and abscess of oral soft tissues 11/23/2015  . Cellulitis 11/23/2015    Past Surgical History:  Procedure Laterality Date  . FRACTURE SURGERY    . I&D EXTREMITY Right 01/30/2016   Procedure: IRRIGATION AND DEBRIDEMENT RIGHT INDEX FINGER;  Surgeon: Betha Loa, MD;  Location: [facility] OR;  Service: Orthopedics;  Laterality: Right;    Current Outpatient Rx  . Order #: [id number] Class: Historical Med  . Order #: [id number] Class: Historical Med  . Order #: [id number] Class: Print  . Order #: [id number] Class: Historical Med  . Order #: [id number] Class: Historical Med  . Order #: [id number] Class: Historical Med  . Order #: [id number] Class: Print  . Order #: [id number] Class: Print  . Order #: [id number] Class: Normal    Allergies Amoxicillin and Toradol [ketorolac tromethamine]  No family history on file.  Social  History Social History  Substance Use Topics  . Smoking status: Current Every Day Smoker    Packs/day: 0.50  . Smokeless tobacco: Never Used  . Alcohol use Yes     Comment: Occasional    Review of Systems  Constitutional: No fever/chills Eyes: No visual changes. ENT: No sore throat. Cardiovascular: Denies chest pain. Respiratory: Denies shortness of breath. Gastrointestinal: No abdominal pain.  No nausea, no vomiting.  No diarrhea.  No constipation. Genitourinary: Negative for dysuria. Musculoskeletal: Negative for back pain. Skin: Left finger wound.  Neurological: Negative for headaches, focal weakness or numbness.  10-point ROS otherwise negative.  ____________________________________________   PHYSICAL EXAM:  VITAL SIGNS: ED Triage Vitals  Enc Vitals Group     BP 04/24/16 1805 125/92     Pulse Rate 04/24/16 1805 80     Resp 04/24/16 1805 16     Temp 04/24/16 1805 98.2 F (36.8 C)     Temp Source 04/24/16 1805 Oral     SpO2 04/24/16 1805 98 %     Weight 04/24/16 1819 215 lb (97.5 kg)     Height 04/24/16 1819 6' (1.829 m)     Pain Score 04/24/16 1819 8   Constitutional: Alert and oriented. Well appearing and in no acute distress. Eyes: Conjunctivae are normal.  Head: Atraumatic. Nose: No congestion/rhinnorhea. Mouth/Throat: Mucous membranes are moist.  Oropharynx non-erythematous. Neck: No stridor.   Musculoskeletal: No lower extremity tenderness nor edema.  Neurologic:  Normal speech and language. No gross focal  neurologic deficits are appreciated.  Skin:  Small, 0.5 cm wound to the left third distal phalynx.  Psychiatric: Mood and affect are normal. Speech and behavior are normal.  ____________________________________________   LABS (all labs ordered are listed, but only abnormal results are displayed)  Labs Reviewed  COMPREHENSIVE METABOLIC PANEL - Abnormal; Notable for the following:       Result Value   Glucose, Bld 104 (*)    All other components  within normal limits  URINALYSIS, ROUTINE W REFLEX MICROSCOPIC - Abnormal; Notable for the following:    APPearance HAZY (*)    All other components within normal limits  I-STAT CG4 LACTIC ACID, ED - Abnormal; Notable for the following:    Lactic Acid, Venous 1.94 (*)    All other components within normal limits  AEROBIC CULTURE (SUPERFICIAL SPECIMEN)  CBC WITH DIFFERENTIAL/PLATELET  I-STAT CG4 LACTIC ACID, ED   ____________________________________________  RADIOLOGY  Dg Hand Complete Left  Result Date: 04/24/2016 CLINICAL DATA:  Pt states this is his 3rd case of MRSA in the last 6 months. He is presently on on bactrim for an infection to the middle digit of his L hand. He came in on Wed as the bactrim wasn't working and was given oral doxy, infection not getting better. EXAM: LEFT HAND - COMPLETE 3+ VIEW COMPARISON:  None. FINDINGS: There is no evidence of fracture or dislocation. No bone destruction or cortical erosion. There is no evidence of arthropathy or other focal bone abnormality. Soft tissues are unremarkable. IMPRESSION: No radiographic changes to suggest osteomyelitis. Electronically Signed   By: Burman NievesWilliam  Stevens M.D.   On: 04/24/2016 22:38    ____________________________________________   PROCEDURES  Procedure(s) performed:   Procedures  None ____________________________________________   INITIAL IMPRESSION / ASSESSMENT AND PLAN / ED COURSE  Pertinent labs & imaging results that were available during my care of the patient were reviewed by me and considered in my medical decision making (see chart for details).  Patient presents to the emergency department for evaluation of left, third digit wound that continues despite changing antibiotics from Bactrim to doxycycline. I reviewed the pictures from the 2/7 emergency department visit and the wound appears similar. It is perhaps slightly more ulcerated at this time but the digit remains normal in color with no area of  fluctuants or obvious fluid collection. There is some minimal clear material draining from the wound. Patient has normal range of motion in the finger. X-ray was obtained showing no obvious bone involvement. With wound not worsening and seeming if anything slightly better I encouraged him to continue and complete his doxycycline course. He is scheduled to follow with infectious disease in March because of recurrent lesions on the hand. He is established with hand surgery but there is no evidence of drainage required at this time. The patient has no evidence of systemic infection by labs or vital signs. Question is there may be a vascular component to this wound. Discussed my impression and plan with the patient in detail. He expresses frustration with these recurrent wounds but will continue antibiotics and follow-up as discussed.    ____________________________________________  FINAL CLINICAL IMPRESSION(S) / ED DIAGNOSES  Final diagnoses:  Open wound of finger, subsequent encounter     MEDICATIONS GIVEN DURING THIS VISIT:  None  NEW OUTPATIENT MEDICATIONS STARTED DURING THIS VISIT:  None   Note:  This document was prepared using Dragon voice recognition software and may include unintentional dictation errors.  Alona BeneJoshua Kataleya Zaugg, MD Emergency Medicine  Maia Plan, MD 04/25/16 1340

## 2016-04-27 LAB — AEROBIC CULTURE W GRAM STAIN (SUPERFICIAL SPECIMEN)

## 2016-04-27 LAB — AEROBIC CULTURE  (SUPERFICIAL SPECIMEN): GRAM STAIN: NONE SEEN

## 2016-05-19 ENCOUNTER — Ambulatory Visit (INDEPENDENT_AMBULATORY_CARE_PROVIDER_SITE_OTHER): Payer: BLUE CROSS/BLUE SHIELD | Admitting: Internal Medicine

## 2016-05-19 ENCOUNTER — Encounter: Payer: Self-pay | Admitting: Internal Medicine

## 2016-05-19 VITALS — BP 129/86 | HR 85 | Temp 98.3°F | Ht 72.0 in | Wt 219.0 lb

## 2016-05-19 DIAGNOSIS — E310 Autoimmune polyglandular failure: Secondary | ICD-10-CM | POA: Diagnosis not present

## 2016-05-19 DIAGNOSIS — A4902 Methicillin resistant Staphylococcus aureus infection, unspecified site: Secondary | ICD-10-CM

## 2016-05-19 DIAGNOSIS — L089 Local infection of the skin and subcutaneous tissue, unspecified: Secondary | ICD-10-CM

## 2016-05-19 NOTE — Progress Notes (Signed)
RFV: recurrent MRSA skin infection  Patient ID: Ronald MaladySimon Schrager, male   DOB: [birth year], 41 y.o.   MRN: [id number]  HPI 41yo M with hx of polyglandular autoimmune syndrome, type 2, and hx of MRSA skin infection, past winter had right ring finger infection requiring I x D by Dr Merlyn LotKuzma, most recently [facility] for Left middle finger skin infection. Given bactrim plus decolonization with chlorhexedine solution for daily hibiclens bathing and mupirocin ointment to use intranasally x 7 days.  He states that he is trying to see endocrinology. Dr Sharl Makerr, since it has been roughly 7 yrs since he has seen endo. He states that he has been fatigued of late. Occ. Cold natured.   Mother has similar AI process.  Outpatient Encounter Prescriptions as of 05/19/2016  Medication Sig  . B Complex-C (B-COMPLEX WITH VITAMIN C) tablet Take 1 tablet by mouth daily.  . cyclobenzaprine (FLEXERIL) 10 MG tablet Take 10 mg by mouth daily as needed for muscle spasms.  Marland Kitchen. gabapentin (NEURONTIN) 300 MG capsule Take 300 mg by mouth 3 (three) times daily.  . Multiple Vitamin (MULTIVITAMIN WITH MINERALS) TABS tablet Take 1 tablet by mouth daily.  . mupirocin ointment (BACTROBAN) 2 % Place 1 application into the nose 2 (two) times daily.  . Probiotic Product (ALIGN PO) Take by mouth.  . doxycycline (VIBRAMYCIN) 100 MG capsule Take 1 capsule (100 mg total) by mouth 2 (two) times daily. (Patient not taking: Reported on 05/19/2016)  . HYDROcodone-acetaminophen (NORCO) 5-325 MG tablet 1-2 tabs po q6 hours prn pain (Patient not taking: Reported on 04/24/2016)  . ondansetron (ZOFRAN) 4 MG tablet Take 1 tablet (4 mg total) by mouth every 8 (eight) hours as needed for nausea or vomiting. (Patient not taking: Reported on 04/24/2016)  . sulfamethoxazole-trimethoprim (BACTRIM DS) 800-160 MG tablet Take 1 tablet by mouth 2 (two) times daily. (Patient not taking: Reported on 05/19/2016)   No facility-administered encounter medications on file as  of 05/19/2016.      Patient Active Problem List   Diagnosis Date Noted  . Cellulitis and abscess of oral soft tissues 11/23/2015  . Cellulitis 11/23/2015     Health Maintenance Due  Topic Date Due  . HIV Screening  05/01/1989  . TETANUS/TDAP  05/01/1993  . INFLUENZA VACCINE  10/15/2015   Family hx: adrenal insufficiency, hypothyroidism, and polyglandular AI syndrome - maternal side  Social History  Substance Use Topics  . Smoking status: Current Every Day Smoker    Packs/day: 0.50  . Smokeless tobacco: Never Used  . Alcohol use Yes     Comment: Occasional   Review of Systems Review of Systems  Constitutional: Negative for fever, chills, diaphoresis, positive for the follow: activity change, appetite change, fatigue and unexpected weight change.  HENT: Negative for congestion, sore throat, rhinorrhea, sneezing, trouble swallowing and sinus pressure.  Eyes: Negative for photophobia and visual disturbance.  Respiratory: Negative for cough, chest tightness, shortness of breath, wheezing and stridor.  Cardiovascular: Negative for chest pain, palpitations and leg swelling.  Gastrointestinal: Negative for nausea, vomiting, abdominal pain, diarrhea, constipation, blood in stool, abdominal distention and anal bleeding.  Genitourinary: Negative for dysuria, hematuria, flank pain and difficulty urinating.  Musculoskeletal: Negative for myalgias, back pain, joint swelling, arthralgias and gait problem.  Skin: Negative for color change, pallor, rash and wound.  Neurological: Negative for dizziness, tremors, weakness and light-headedness.  Hematological: Negative for adenopathy. Does not bruise/bleed easily.  Psychiatric/Behavioral: Negative for behavioral problems, confusion, sleep disturbance, dysphoric  mood, decreased concentration and agitation.    Physical Exam   BP 129/86   Pulse 85   Temp 98.3 F (36.8 C) (Oral)   Ht 6' (1.829 m)   Wt 219 lb (99.3 kg)   BMI 29.70 kg/m     Physical Exam  Constitutional: He is oriented to person, place, and time. He appears well-developed and well-nourished. No distress.  HENT:  Mouth/Throat: Oropharynx is clear and moist. No oropharyngeal exudate.  Cardiovascular: Normal rate, regular rhythm and normal heart sounds. Exam reveals no gallop and no friction rub.  No murmur heard.  Pulmonary/Chest: Effort normal and breath sounds normal. No respiratory distress. He has no wheezes.  Abdominal: Soft. Bowel sounds are normal. He exhibits no distension. There is no tenderness.  Lymphadenopathy:  He has no cervical adenopathy.  Neurological: He is alert and oriented to person, place, and time.  Skin: Skin is warm and dry. No rash noted. No erythema.  Hand a dry, peeling, nails appear to have nail bitting CBC Lab Results  Component Value Date   WBC 9.3 04/24/2016   RBC 4.98 04/24/2016   HGB 15.8 04/24/2016   HCT 45.8 04/24/2016   PLT 317 04/24/2016   MCV 92.0 04/24/2016   MCH 31.7 04/24/2016   MCHC 34.5 04/24/2016   RDW 12.5 04/24/2016   LYMPHSABS 2.7 04/24/2016   MONOABS 0.4 04/24/2016   EOSABS 0.1 04/24/2016    BMET Lab Results  Component Value Date   NA 135 04/24/2016   K 4.0 04/24/2016   CL 102 04/24/2016   CO2 24 04/24/2016   GLUCOSE 104 (H) 04/24/2016   BUN 7 04/24/2016   CREATININE 1.06 04/24/2016   CALCIUM 9.6 04/24/2016   GFRNONAA >60 04/24/2016   GFRAA >60 04/24/2016      Assessment and Plan  MRSA skin infection appears recovering from his skin lesion to right hand. He has finished decolonization. From my standpoint, not much more to offer. For upcoming steroid injection for chronic back pain: recommended that he does hibiclens bath the night prior to procedure.  autoimmune disorder = we will see how to facilitate him getting to endocrine to see if he has hypothyroidism, adrenal insufficiency similar to his mother

## 2017-06-21 IMAGING — DX DG HAND COMPLETE 3+V*L*
3 series · 3 of 3 positions shown · non-contrast
Comparison: None.

CLINICAL DATA: Pt states this is his 3rd case of MRSA in the last 6
months. He is presently on on bactrim for an infection to the middle
digit of his L hand. He came in on Wed as the bactrim wasn't working
and was given oral doxy, infection not getting better.

EXAM:
LEFT HAND - COMPLETE 3+ VIEW

[hand pa]
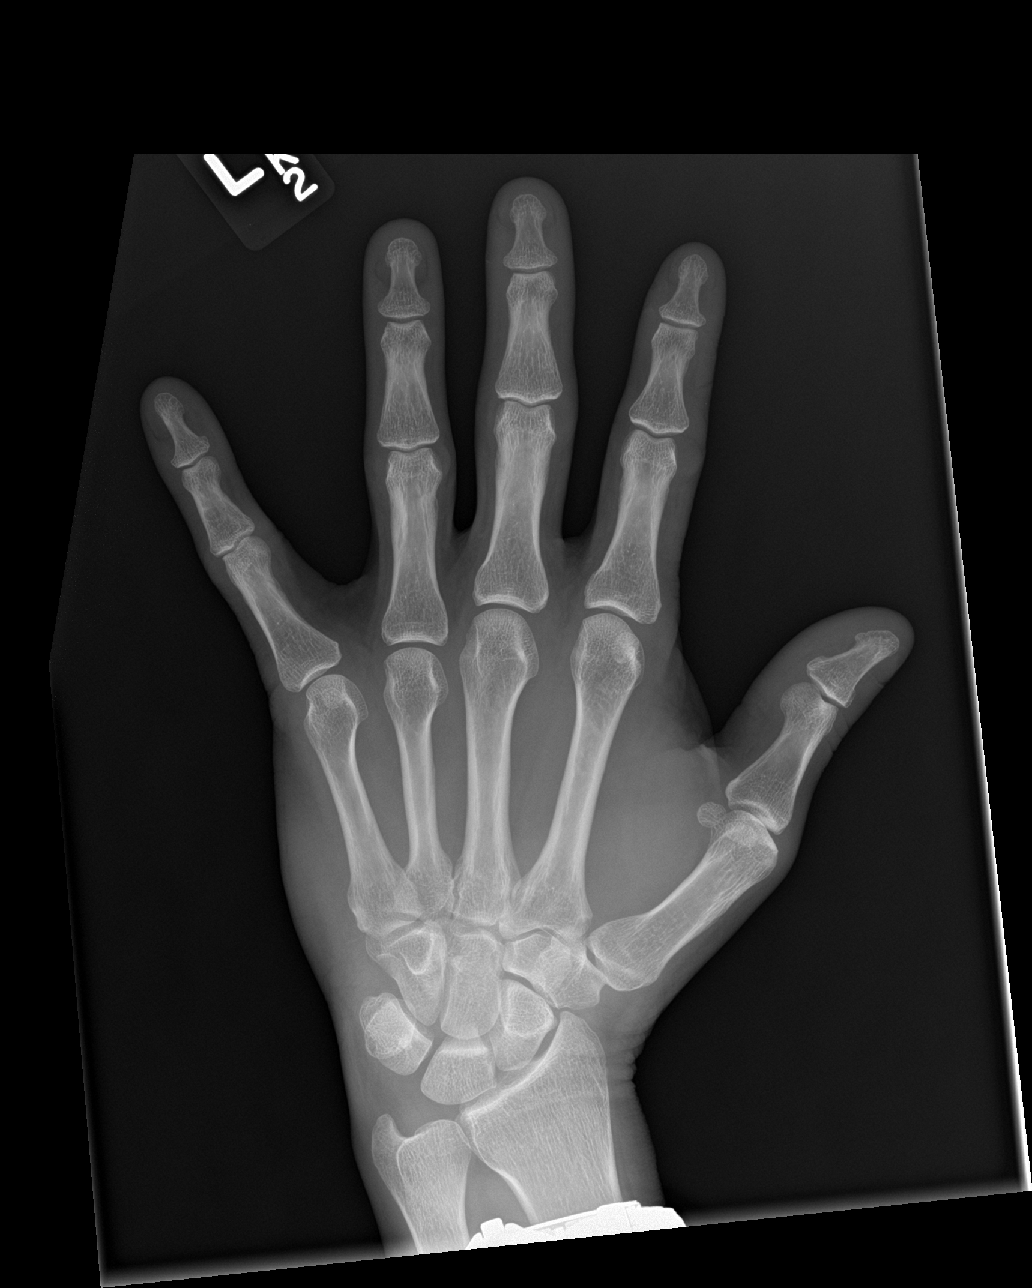

[hand obl]
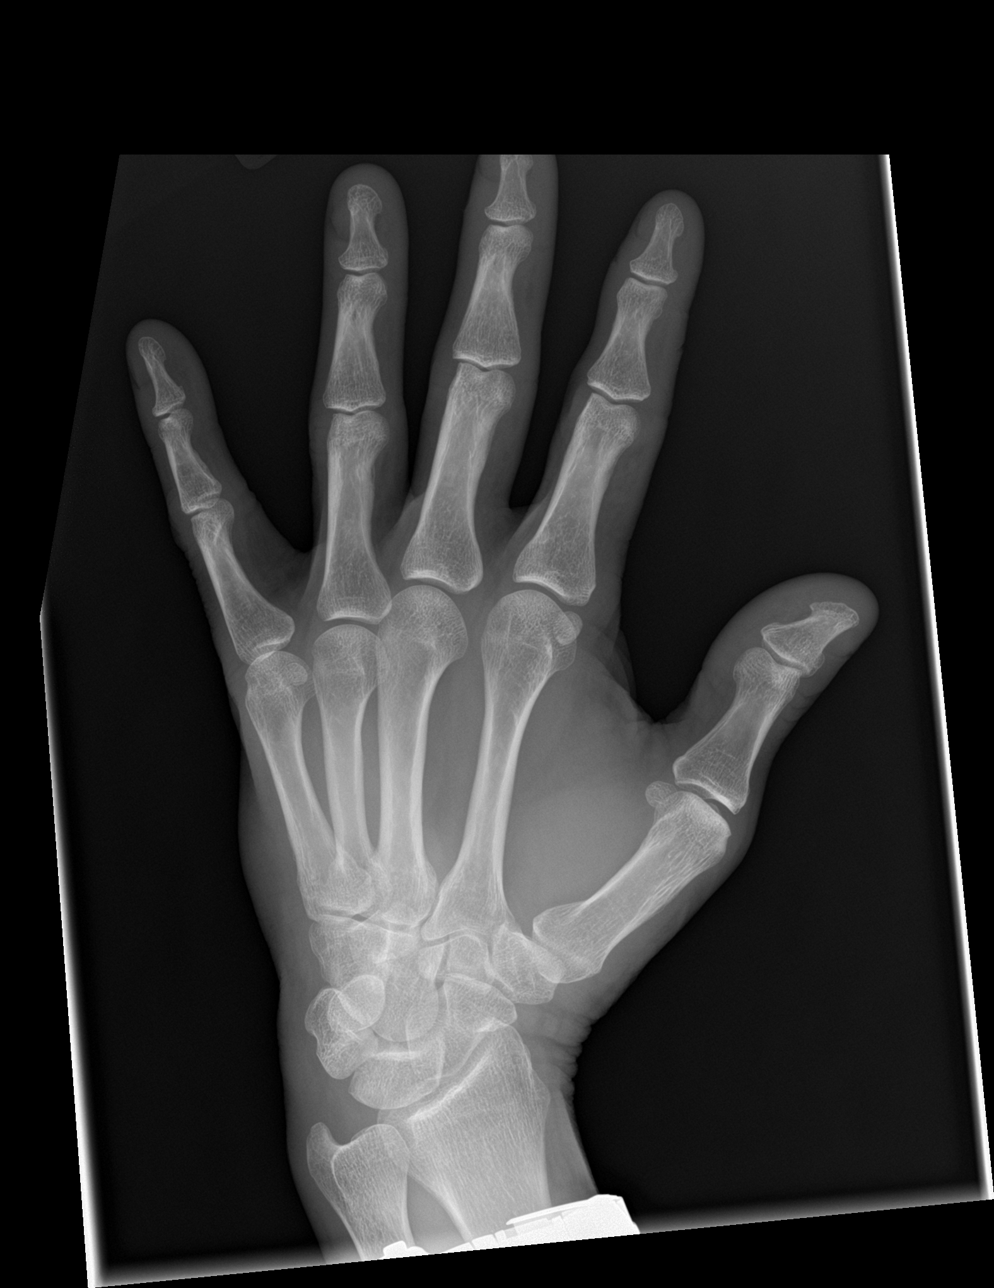

[hand lat]
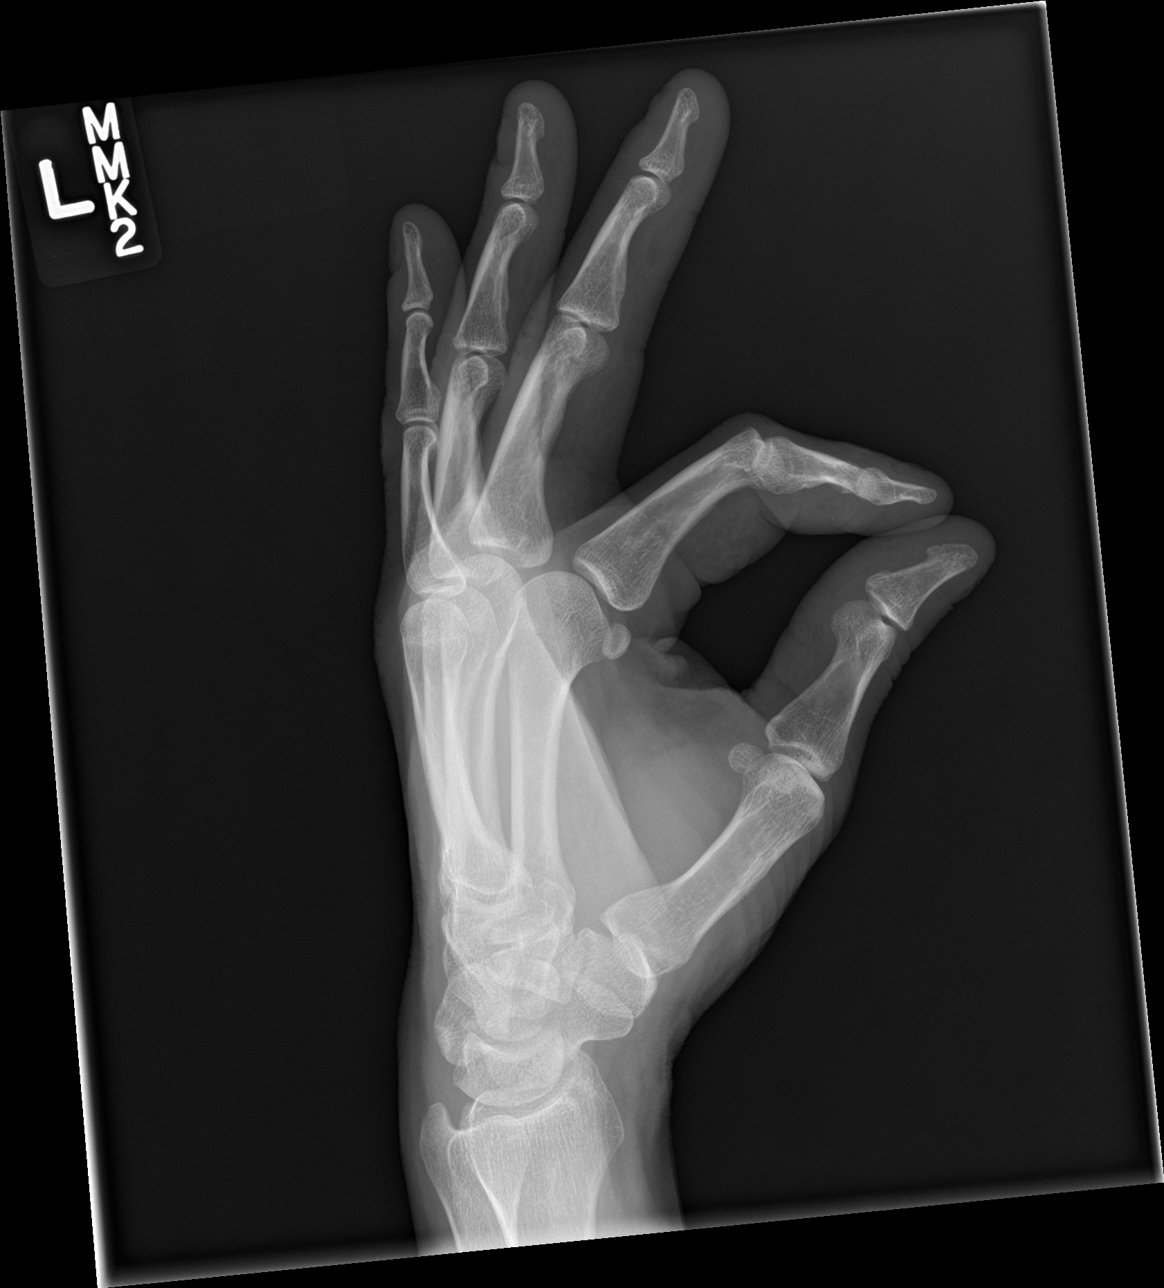

[3 of 3 positions shown; findings below may reference images not displayed]

FINDINGS: There is no evidence of fracture or dislocation. No bone destruction
or cortical erosion. There is no evidence of arthropathy or other
focal bone abnormality. Soft tissues are unremarkable.
IMPRESSION: No radiographic changes to suggest osteomyelitis.
